# Patient Record
Sex: Male | Born: 1935 | Race: White | Hispanic: No | Smoking: Never smoker
Health system: Southern US, Community
[De-identification: ages and names within clinical notes are randomized; demographics above are authoritative.]

## PROBLEM LIST (undated history)

## (undated) DIAGNOSIS — R32 Unspecified urinary incontinence: Secondary | ICD-10-CM

## (undated) DIAGNOSIS — E119 Type 2 diabetes mellitus without complications: Secondary | ICD-10-CM

## (undated) DIAGNOSIS — C61 Malignant neoplasm of prostate: Secondary | ICD-10-CM

## (undated) DIAGNOSIS — N189 Chronic kidney disease, unspecified: Secondary | ICD-10-CM

## (undated) HISTORY — DX: Type 2 diabetes mellitus without complications: E11.9

## (undated) HISTORY — PX: PROSTATE SURGERY: SHX751

## (undated) HISTORY — DX: Chronic kidney disease, unspecified: N18.9

---

## 2008-12-31 ENCOUNTER — Inpatient Hospital Stay: Payer: Self-pay | Admitting: Internal Medicine

## 2010-11-21 DIAGNOSIS — G4700 Insomnia, unspecified: Secondary | ICD-10-CM | POA: Insufficient documentation

## 2011-02-25 DIAGNOSIS — R269 Unspecified abnormalities of gait and mobility: Secondary | ICD-10-CM | POA: Insufficient documentation

## 2013-02-16 DIAGNOSIS — E119 Type 2 diabetes mellitus without complications: Secondary | ICD-10-CM | POA: Insufficient documentation

## 2013-03-10 DIAGNOSIS — I10 Essential (primary) hypertension: Secondary | ICD-10-CM | POA: Insufficient documentation

## 2013-06-24 ENCOUNTER — Ambulatory Visit (INDEPENDENT_AMBULATORY_CARE_PROVIDER_SITE_OTHER): Payer: Medicare Other | Admitting: Podiatry

## 2013-06-24 ENCOUNTER — Encounter: Payer: Self-pay | Admitting: Podiatry

## 2013-06-24 VITALS — BP 129/74 | HR 70 | Resp 14 | Ht 71.0 in | Wt 158.0 lb

## 2013-06-24 DIAGNOSIS — M79609 Pain in unspecified limb: Secondary | ICD-10-CM

## 2013-06-24 DIAGNOSIS — B351 Tinea unguium: Secondary | ICD-10-CM

## 2013-06-24 NOTE — Progress Notes (Signed)
N diabetic foot care. Painful thick toenails  L b/l  1-5  D diabetic 10years  a1c 6.4 O C A T try to cut self but keep cutting skin

## 2013-06-24 NOTE — Progress Notes (Signed)
Subjective:     Patient ID: Derek Guerra, male   DOB: [birth year], 79 y.o.   MRN: [id number]  HPI patient presents on referral from family physician stating my nails are thick painful and I cannot cut them anymore. States he has had diabetes for 10 years   Review of Systems  All other systems reviewed and are negative.       Objective:   Physical Exam  Nursing note and vitals reviewed. Constitutional: He appears well-developed and well-nourished.  Cardiovascular: Intact distal pulses.   Musculoskeletal: Normal range of motion.  Neurological: He is alert.   muscle strength found to be adequate with mild restriction of motion subtalar and midtarsal joint. Nailbeds are thick and dystrophic 1-5 bilateral     Assessment:     Long-term diabetic with thick painful nailbeds 1-5 bilateral    Plan:     H&P performed with diabetic education discussed. Debridement nailbeds 1-5 bilateral no iatrogenic bleeding noted

## 2013-09-23 ENCOUNTER — Ambulatory Visit: Payer: Medicare Other | Admitting: Podiatry

## 2013-09-27 ENCOUNTER — Ambulatory Visit: Payer: Medicare Other | Admitting: Podiatry

## 2013-09-30 ENCOUNTER — Ambulatory Visit (INDEPENDENT_AMBULATORY_CARE_PROVIDER_SITE_OTHER): Payer: Medicare Other | Admitting: Podiatry

## 2013-09-30 ENCOUNTER — Encounter: Payer: Self-pay | Admitting: Podiatry

## 2013-09-30 VITALS — BP 117/76 | HR 78 | Resp 16 | Ht 70.0 in | Wt 157.0 lb

## 2013-09-30 DIAGNOSIS — M79609 Pain in unspecified limb: Secondary | ICD-10-CM

## 2013-09-30 DIAGNOSIS — B351 Tinea unguium: Secondary | ICD-10-CM

## 2013-09-30 NOTE — Progress Notes (Signed)
Subjective:     Patient ID: Derek Guerra, male   DOB: 1932-02-13, 80 y.o.   MRN: [id number]  HPI patient presents with nail disease and thickness of bed 1 through 5 of both feet that are painful   Review of Systems     Objective:   Physical Exam Neurovascular status unchanged with painful nailbeds 1-5 both feet that are thick and the corner    Assessment:     Mycotic nail infection with pain 1-5 both feet    Plan:     Debridement painful nail bed 1-5 both feet with no active bleeding noted

## 2013-12-30 ENCOUNTER — Ambulatory Visit: Payer: Medicare Other | Admitting: Podiatry

## 2014-01-06 ENCOUNTER — Ambulatory Visit (INDEPENDENT_AMBULATORY_CARE_PROVIDER_SITE_OTHER): Payer: Medicare Other | Admitting: Podiatry

## 2014-01-06 VITALS — Resp 16 | Ht 71.0 in | Wt 160.0 lb

## 2014-01-06 DIAGNOSIS — B351 Tinea unguium: Secondary | ICD-10-CM

## 2014-01-06 DIAGNOSIS — M79609 Pain in unspecified limb: Secondary | ICD-10-CM

## 2014-01-08 NOTE — Progress Notes (Signed)
Subjective:     Patient ID: Derek Guerra, male   DOB: 02/06/1932, 80 y.o.   MRN: [id number]  HPI patient presents with painful nailbeds of both feet that are thick and impossible for him to cut   Review of Systems     Objective:   Physical Exam Neurovascular status unchanged with patient well oriented x3 and is found to have thick painful nail bed 1-5 both feet with brittle yellow appearance    Assessment:     Mycotic nail infection with pain 1-5 both feet    Plan:     Debridement painful nail bed 1-5 both feet with no iatrogenic bleeding noted

## 2014-03-20 DIAGNOSIS — Z8546 Personal history of malignant neoplasm of prostate: Secondary | ICD-10-CM | POA: Insufficient documentation

## 2014-03-23 DIAGNOSIS — N319 Neuromuscular dysfunction of bladder, unspecified: Secondary | ICD-10-CM | POA: Insufficient documentation

## 2014-03-27 DIAGNOSIS — E1122 Type 2 diabetes mellitus with diabetic chronic kidney disease: Secondary | ICD-10-CM | POA: Insufficient documentation

## 2014-06-09 ENCOUNTER — Ambulatory Visit (INDEPENDENT_AMBULATORY_CARE_PROVIDER_SITE_OTHER): Payer: Medicare Other | Admitting: Podiatry

## 2014-06-09 DIAGNOSIS — B351 Tinea unguium: Secondary | ICD-10-CM

## 2014-06-09 DIAGNOSIS — M79676 Pain in unspecified toe(s): Secondary | ICD-10-CM

## 2014-06-11 NOTE — Progress Notes (Signed)
Subjective:     Patient ID: Derek Guerra, male   DOB: 01/21/1932, 80 y.o.   MRN: [id number]  HPI patient presents with painful nailbeds 1-5 both feet that he cannot cut   Review of Systems     Objective:   Physical Exam Neurovascular status intact with thick yellow brittle nailbeds 1-5 of both feet    Assessment:     Mycotic nail infection with pain 1-5 both feet    Plan:     Debris painful nailbeds 1-5 both feet with no iatrogenic bleeding noted

## 2014-08-14 DIAGNOSIS — Z9889 Other specified postprocedural states: Secondary | ICD-10-CM | POA: Insufficient documentation

## 2014-08-14 DIAGNOSIS — N289 Disorder of kidney and ureter, unspecified: Secondary | ICD-10-CM | POA: Insufficient documentation

## 2014-08-14 DIAGNOSIS — I700 Atherosclerosis of aorta: Secondary | ICD-10-CM | POA: Insufficient documentation

## 2014-08-14 DIAGNOSIS — I739 Peripheral vascular disease, unspecified: Secondary | ICD-10-CM | POA: Insufficient documentation

## 2014-08-14 DIAGNOSIS — E119 Type 2 diabetes mellitus without complications: Secondary | ICD-10-CM | POA: Insufficient documentation

## 2014-08-14 DIAGNOSIS — I519 Heart disease, unspecified: Secondary | ICD-10-CM | POA: Insufficient documentation

## 2014-08-14 DIAGNOSIS — E782 Mixed hyperlipidemia: Secondary | ICD-10-CM | POA: Insufficient documentation

## 2014-09-15 ENCOUNTER — Ambulatory Visit (INDEPENDENT_AMBULATORY_CARE_PROVIDER_SITE_OTHER): Payer: Medicare Other | Admitting: Podiatry

## 2014-09-15 DIAGNOSIS — B351 Tinea unguium: Secondary | ICD-10-CM

## 2014-09-15 DIAGNOSIS — M79676 Pain in unspecified toe(s): Secondary | ICD-10-CM | POA: Diagnosis not present

## 2014-09-15 NOTE — Progress Notes (Signed)
Subjective:     Patient ID: Derek Guerra, male   DOB: 1932/05/25, 82 y.o.   MRN: [id number]  HPI patient presents with painful nailbeds 1-5 both feet that he cannot cut   Review of Systems     Objective:   Physical Exam Neurovascular status intact with thick yellow brittle nailbeds 1-5 of both feet    Assessment:     Mycotic nail infection with pain 1-5 both feet    Plan:     Debris painful nailbeds 1-5 both feet with no iatrogenic bleeding noted

## 2014-10-10 DIAGNOSIS — N3020 Other chronic cystitis without hematuria: Secondary | ICD-10-CM | POA: Insufficient documentation

## 2014-10-10 DIAGNOSIS — R339 Retention of urine, unspecified: Secondary | ICD-10-CM | POA: Insufficient documentation

## 2014-12-01 ENCOUNTER — Ambulatory Visit: Payer: Medicare Other

## 2014-12-08 ENCOUNTER — Ambulatory Visit (INDEPENDENT_AMBULATORY_CARE_PROVIDER_SITE_OTHER): Payer: Medicare Other | Admitting: Podiatry

## 2014-12-08 DIAGNOSIS — B351 Tinea unguium: Secondary | ICD-10-CM

## 2014-12-08 DIAGNOSIS — M79676 Pain in unspecified toe(s): Secondary | ICD-10-CM

## 2014-12-08 NOTE — Progress Notes (Signed)
Subjective:     Patient ID: Derek Guerra, male   DOB: 17-Jun-1932, 81 y.o.   MRN: [id number]  HPI patient presents with painful nailbeds 1-5 both feet that he cannot cut   Review of Systems     Objective:   Physical Exam Neurovascular status intact with thick yellow brittle nailbeds 1-5 of both feet    Assessment:     Mycotic nail infection with pain 1-5 both feet    Plan:     Debris painful nailbeds 1-5 both feet with no iatrogenic bleeding noted

## 2015-02-02 DIAGNOSIS — I10 Essential (primary) hypertension: Secondary | ICD-10-CM | POA: Insufficient documentation

## 2015-02-14 DIAGNOSIS — I493 Ventricular premature depolarization: Secondary | ICD-10-CM | POA: Insufficient documentation

## 2015-03-09 ENCOUNTER — Ambulatory Visit: Payer: Medicare Other

## 2015-03-20 ENCOUNTER — Ambulatory Visit (INDEPENDENT_AMBULATORY_CARE_PROVIDER_SITE_OTHER): Payer: Medicare Other | Admitting: Podiatry

## 2015-03-20 DIAGNOSIS — M79676 Pain in unspecified toe(s): Secondary | ICD-10-CM

## 2015-03-20 DIAGNOSIS — B351 Tinea unguium: Secondary | ICD-10-CM | POA: Diagnosis not present

## 2015-03-20 NOTE — Progress Notes (Signed)
Subjective: 81 y.o. male returns the office today for painful, elongated, thickened toenails which he is unable to trim himself. Denies any redness or drainage around the nails. Denies any acute changes since last appointment and no new complaints today. Denies any systemic complaints such as fevers, chills, nausea, vomiting.   Objective: AAO 3, NAD DP/PT pulses palpable, CRT less than 3 seconds Nails hypertrophic, dystrophic, elongated, brittle, discolored 10. There is tenderness overlying the nails 1-5 bilaterally. There is no surrounding erythema or drainage along the nail sites. No open lesions or pre-ulcerative lesions are identified. No other areas of tenderness bilateral lower extremities. No overlying edema, erythema, increased warmth. No pain with calf compression, swelling, warmth, erythema.  Assessment: Patient presents with symptomatic onychomycosis  Plan: -Treatment options including alternatives, risks, complications were discussed -Nails sharply debrided 10 without complication/bleeding. -Discussed daily foot inspection. If there are any changes, to call the office immediately.  -Follow-up in 3 months or sooner if any problems are to arise. In the meantime, encouraged to call the office with any questions, concerns, changes symptoms.   Celesta Gentile, DPM

## 2015-06-19 ENCOUNTER — Ambulatory Visit: Payer: Medicare Other

## 2015-06-19 ENCOUNTER — Encounter: Payer: Self-pay | Admitting: Sports Medicine

## 2015-06-19 ENCOUNTER — Ambulatory Visit (INDEPENDENT_AMBULATORY_CARE_PROVIDER_SITE_OTHER): Payer: Medicare Other | Admitting: Sports Medicine

## 2015-06-19 VITALS — BP 138/83 | HR 72 | Resp 14

## 2015-06-19 DIAGNOSIS — B351 Tinea unguium: Secondary | ICD-10-CM | POA: Diagnosis not present

## 2015-06-19 DIAGNOSIS — M79673 Pain in unspecified foot: Secondary | ICD-10-CM | POA: Diagnosis not present

## 2015-06-19 DIAGNOSIS — M79605 Pain in left leg: Secondary | ICD-10-CM

## 2015-06-19 DIAGNOSIS — E119 Type 2 diabetes mellitus without complications: Secondary | ICD-10-CM | POA: Diagnosis not present

## 2015-06-19 DIAGNOSIS — M79604 Pain in right leg: Secondary | ICD-10-CM

## 2015-06-19 NOTE — Progress Notes (Signed)
Patient ID: Derek Guerra, male   DOB: [birth year], 81 y.o.   MRN: [id number]  Subjective: Derek Guerra is a 81 y.o. male patient with history of type 2 diabetes who presents to office today complaining of long, painful nails  while ambulating in shoes. Patient states that the glucose reading this morning was not recorderd. Diagnosed with DM many years ago. Patient denies any new changes in medication or new problems. Patient denies any new cramping, numbness, burning or tingling in the legs.  There are no active problems to display for this patient.  Current Outpatient Prescriptions on File Prior to Visit  Medication Sig Dispense Refill  . amitriptyline (ELAVIL) 10 MG tablet Take 10 mg by mouth. 2 tablets at bedtime    . aspirin 81 MG tablet Take 81 mg by mouth daily.    . enalapril (VASOTEC) 10 MG tablet Take 10 mg by mouth daily.    Marland Kitchen linagliptin (TRADJENTA) 5 MG TABS tablet Take 5 mg by mouth daily.    . rosuvastatin (CRESTOR) 5 MG tablet Take 5 mg by mouth. 3 times  weekly     No current facility-administered medications on file prior to visit.   Allergies  Allergen Reactions  . Gemfibrozil     Other reaction(s): MUSCLE PAIN  . Niacin     Other reaction(s): MUSCLE PAIN  . Nifedipine     Other reaction(s): MUSCLE PAIN  . Simvastatin     Other reaction(s): MUSCLE PAIN     Labs:HEMOGLOBIN A1C- No recent lab on file   Objective: General: Patient is awake, alert, and oriented x 3 and in no acute distress. Ambulates with assistance of rolling walker.   Integument: Skin is warm, dry and supple bilateral. Nails are tender, long, thickened and  dystrophic with subungual debris, consistent with onychomycosis, 1-5 bilateral. No signs of infection. No open lesions or preulcerative lesions present bilateral. Remaining integument unremarkable.  Vasculature:  Dorsalis Pedis pulse 1/4 bilateral. Posterior Tibial pulse  2/4 bilateral.  Capillary fill time <3 sec 1-5 bilateral. Scant hair growth  to the level of the digits. Temperature gradient within normal limits. Mild varicosities present bilateral. No edema present bilateral.   Neurology: The patient has intact sensation measured with a 5.07/10g Semmes Weinstein Monofilament at all pedal sites bilateral . Vibratory sensation slightly diminished bilateral with tuning fork. No Babinski sign present bilateral.   Musculoskeletal: No gross pedal deformities noted bilateral. Muscular strength 5/5 in all lower extremity muscular groups bilateral without pain or limitation on range of motion . No tenderness with calf compression bilateral.  Assessment and Plan: Problem List Items Addressed This Visit    None    Visit Diagnoses    Dermatophytosis of nail    -  Primary    Pain in both lower extremities        Diabetes mellitus without complication (HCC)        Relevant Medications    metFORMIN (GLUCOPHAGE) 1000 MG tablet      -Examined patient. -Discussed and educated patient on diabetic foot care, especially with  regards to the vascular, neurological and musculoskeletal systems.  -Stressed the importance of good glycemic control and the detriment of not  controlling glucose levels in relation to the foot. -Mechanically debrided all nails 1-5 bilateral using sterile nail nipper and filed with dremel without incident  -Answered all patient questions -Patient to return in 3 months for diabetic foot care -Patient advised to call the office if any problems or questions arise in  the  Meantime.  [name], DPM

## 2015-08-22 DIAGNOSIS — R2681 Unsteadiness on feet: Secondary | ICD-10-CM | POA: Insufficient documentation

## 2015-09-18 ENCOUNTER — Encounter: Payer: Self-pay | Admitting: Sports Medicine

## 2015-09-18 ENCOUNTER — Ambulatory Visit (INDEPENDENT_AMBULATORY_CARE_PROVIDER_SITE_OTHER): Payer: Medicare Other | Admitting: Sports Medicine

## 2015-09-18 ENCOUNTER — Ambulatory Visit: Payer: Medicare Other | Admitting: Sports Medicine

## 2015-09-18 DIAGNOSIS — E119 Type 2 diabetes mellitus without complications: Secondary | ICD-10-CM | POA: Diagnosis not present

## 2015-09-18 DIAGNOSIS — M79676 Pain in unspecified toe(s): Secondary | ICD-10-CM | POA: Diagnosis not present

## 2015-09-18 DIAGNOSIS — B351 Tinea unguium: Secondary | ICD-10-CM | POA: Diagnosis not present

## 2015-09-18 NOTE — Progress Notes (Signed)
Patient ID: Derek Guerra, male   DOB: Feb 27, 1932, 82 y.o.   MRN: [id number]  Subjective: Derek Guerra is a 82 y.o. male patient with history of type 2 diabetes who presents to office today complaining of long, painful nails  while ambulating in shoes. Patient states that the glucose reading this morning was not recorderd. Patient denies any new changes in medication or new problems. Patient denies any new cramping, numbness, burning or tingling in the legs.  There are no active problems to display for this patient.  Current Outpatient Prescriptions on File Prior to Visit  Medication Sig Dispense Refill  . amitriptyline (ELAVIL) 10 MG tablet Take 10 mg by mouth. 2 tablets at bedtime    . aspirin 81 MG tablet Take 81 mg by mouth daily.    . enalapril (VASOTEC) 10 MG tablet Take 10 mg by mouth daily.    Marland Kitchen linagliptin (TRADJENTA) 5 MG TABS tablet Take 5 mg by mouth daily.    . metFORMIN (GLUCOPHAGE) 1000 MG tablet     . nitrofurantoin (MACRODANTIN) 50 MG capsule TAKE 1 CAPSULE (50 MG TOTAL) BY MOUTH DAILY. TO START AFTER LEVAQUIN  2  . rosuvastatin (CRESTOR) 5 MG tablet Take 5 mg by mouth. 3 times  weekly    . tamsulosin (FLOMAX) 0.4 MG CAPS capsule TAKE 1 CAPSULE (0.4 MG TOTAL) BY MOUTH DAILY WITH BREAKFAST.  3   No current facility-administered medications on file prior to visit.   Allergies  Allergen Reactions  . Gemfibrozil Other (See Comments)    Other reaction(s): MUSCLE PAIN Other reaction(s): MUSCLE PAIN  . Niacin Other (See Comments)    Other reaction(s): MUSCLE PAIN Other reaction(s): MUSCLE PAIN  . Nifedipine Other (See Comments)    Other reaction(s): MUSCLE PAIN Other reaction(s): MUSCLE PAIN  . Simvastatin Other (See Comments)    Other reaction(s): MUSCLE PAIN Other reaction(s): MUSCLE PAIN   Labs:HEMOGLOBIN A1C- No recent lab on file   Objective: General: Patient is awake, alert, and oriented x 3 and in no acute distress. Ambulates with assistance of rolling walker.    Integument: Skin is warm, dry and supple bilateral. Nails are tender, long, thickened and  dystrophic with subungual debris, consistent with onychomycosis, 1-5 bilateral. No signs of infection. No open lesions or preulcerative lesions present bilateral. Remaining integument unremarkable.  Vasculature:  Dorsalis Pedis pulse 1/4 bilateral. Posterior Tibial pulse  2/4 bilateral.  Capillary fill time <3 sec 1-5 bilateral. Scant hair growth to the level of the digits. Temperature gradient within normal limits. Mild varicosities present bilateral. No edema present bilateral.   Neurology: The patient has intact sensation measured with a 5.07/10g Semmes Weinstein Monofilament at all pedal sites bilateral . Vibratory sensation slightly diminished bilateral with tuning fork. No Babinski sign present bilateral.   Musculoskeletal: No gross pedal deformities noted bilateral. Muscular strength 5/5 in all lower extremity muscular groups bilateral without pain or limitation on range of motion . No tenderness with calf compression bilateral.  Assessment and Plan: Problem List Items Addressed This Visit    None    Visit Diagnoses    Dermatophytosis of nail    -  Primary    Pain of toe, unspecified laterality        Diabetes mellitus without complication ([place])          -Examined patient. -Discussed and educated patient on diabetic foot care, especially with  regards to the vascular, neurological and musculoskeletal systems.  -Stressed the importance of good glycemic control and the  detriment of not  controlling glucose levels in relation to the foot. -Mechanically debrided all nails 1-5 bilateral using sterile nail nipper and filed with dremel without incident  -Answered all patient questions -Patient to return in 3 months for diabetic foot care -Patient advised to call the office if any problems or questions arise in the meantime.  [name], DPM

## 2015-12-18 ENCOUNTER — Encounter: Payer: Self-pay | Admitting: Sports Medicine

## 2015-12-18 ENCOUNTER — Ambulatory Visit (INDEPENDENT_AMBULATORY_CARE_PROVIDER_SITE_OTHER): Payer: Medicare Other | Admitting: Sports Medicine

## 2015-12-18 DIAGNOSIS — M79676 Pain in unspecified toe(s): Secondary | ICD-10-CM

## 2015-12-18 DIAGNOSIS — B351 Tinea unguium: Secondary | ICD-10-CM

## 2015-12-18 DIAGNOSIS — E119 Type 2 diabetes mellitus without complications: Secondary | ICD-10-CM | POA: Diagnosis not present

## 2015-12-18 NOTE — Progress Notes (Signed)
Patient ID: Derek Guerra, male   DOB: June 30, 1932, 83 y.o.   MRN: [id number] Subjective: Derek Guerra is a 82 y.o. male patient with history of type 2 diabetes who presents to office today complaining of long, painful nails  while ambulating in shoes. Patient states that the glucose reading this morning was not recorderd. Patient denies any new changes in medication or new problems. Patient denies any new cramping, numbness, burning or tingling in the legs.  There are no active problems to display for this patient.  Current Outpatient Prescriptions on File Prior to Visit  Medication Sig Dispense Refill  . amitriptyline (ELAVIL) 10 MG tablet Take 10 mg by mouth. 2 tablets at bedtime    . aspirin 81 MG tablet Take 81 mg by mouth daily.    . enalapril (VASOTEC) 10 MG tablet Take 10 mg by mouth daily.    Marland Kitchen linagliptin (TRADJENTA) 5 MG TABS tablet Take 5 mg by mouth daily.    . metFORMIN (GLUCOPHAGE) 1000 MG tablet     . nitrofurantoin (MACRODANTIN) 50 MG capsule TAKE 1 CAPSULE (50 MG TOTAL) BY MOUTH DAILY. TO START AFTER LEVAQUIN  2  . rosuvastatin (CRESTOR) 5 MG tablet Take 5 mg by mouth. 3 times  weekly    . tamsulosin (FLOMAX) 0.4 MG CAPS capsule TAKE 1 CAPSULE (0.4 MG TOTAL) BY MOUTH DAILY WITH BREAKFAST.  3   No current facility-administered medications on file prior to visit.   Allergies  Allergen Reactions  . Gemfibrozil Other (See Comments)    Other reaction(s): MUSCLE PAIN Other reaction(s): MUSCLE PAIN  . Niacin Other (See Comments)    Other reaction(s): MUSCLE PAIN Other reaction(s): MUSCLE PAIN  . Nifedipine Other (See Comments)    Other reaction(s): MUSCLE PAIN Other reaction(s): MUSCLE PAIN  . Simvastatin Other (See Comments)    Other reaction(s): MUSCLE PAIN Other reaction(s): MUSCLE PAIN   Labs:HEMOGLOBIN A1C- No recent lab on file   Objective: General: Patient is awake, alert, and oriented x 3 and in no acute distress. Ambulates with assistance of rolling walker.    Integument: Skin is warm, dry and supple bilateral. Nails are tender, long, thickened and  dystrophic with subungual debris, consistent with onychomycosis, 1-5 bilateral. Dry blood under left 4th toenail with no signs of infection. No open lesions or preulcerative lesions present bilateral. Remaining integument unremarkable.  Vasculature:  Dorsalis Pedis pulse 1/4 bilateral. Posterior Tibial pulse  2/4 bilateral.  Capillary fill time <3 sec 1-5 bilateral. Scant hair growth to the level of the digits. Temperature gradient within normal limits. Mild varicosities present bilateral. No edema present bilateral.   Neurology: The patient has intact sensation measured with a 5.07/10g Semmes Weinstein Monofilament at all pedal sites bilateral . Vibratory sensation slightly diminished bilateral with tuning fork. No Babinski sign present bilateral.   Musculoskeletal: No gross pedal deformities noted bilateral. Muscular strength 5/5 in all lower extremity muscular groups bilateral without pain or limitation on range of motion . No tenderness with calf compression bilateral.  Assessment and Plan: Problem List Items Addressed This Visit    None    Visit Diagnoses    Dermatophytosis of nail    -  Primary    Pain of toe, unspecified laterality        Diabetes mellitus without complication ([place])          -Examined patient. -Discussed and educated patient on diabetic foot care, especially with  regards to the vascular, neurological and musculoskeletal systems.  -Stressed the importance  of good glycemic control and the detriment of not  controlling glucose levels in relation to the foot. -Mechanically debrided all nails 1-5 bilateral using sterile nail nipper and filed with dremel without incident  -Answered all patient questions -Patient to return in 3 months for diabetic foot care -Patient advised to call the office if any problems or questions arise in the meantime.  [name], DPM

## 2016-03-18 ENCOUNTER — Encounter: Payer: Self-pay | Admitting: Sports Medicine

## 2016-03-18 ENCOUNTER — Ambulatory Visit (INDEPENDENT_AMBULATORY_CARE_PROVIDER_SITE_OTHER): Payer: Medicare Other | Admitting: Sports Medicine

## 2016-03-18 DIAGNOSIS — E119 Type 2 diabetes mellitus without complications: Secondary | ICD-10-CM | POA: Diagnosis not present

## 2016-03-18 DIAGNOSIS — B351 Tinea unguium: Secondary | ICD-10-CM

## 2016-03-18 DIAGNOSIS — M79676 Pain in unspecified toe(s): Secondary | ICD-10-CM | POA: Diagnosis not present

## 2016-03-18 NOTE — Progress Notes (Signed)
Patient ID: Derek Guerra, male   DOB: 1932-04-20, 82 y.o.   MRN: [id number]  Subjective: Derek Guerra is a 82 y.o. male patient with history of type 2 diabetes who presents to office today complaining of long, painful nails  while ambulating in shoes. Patient states that the glucose reading this morning was not recorded; a1c 6.5. Patient denies any new changes in medication or new problems. Patient denies any new cramping, numbness, burning or tingling in the legs.  Patient Active Problem List   Diagnosis Date Noted  . Unsteadiness 08/22/2015  . Beat, premature ventricular 02/14/2015  . Benign essential HTN 02/02/2015  . Bladder infection, chronic 10/10/2014  . Incomplete bladder emptying 10/10/2014  . Atherosclerosis of aorta ([place]) 08/14/2014  . Diabetes mellitus ([place]) 08/14/2014  . History of cardiac catheterization 08/14/2014  . Left ventricular dysfunction 08/14/2014  . Combined fat and carbohydrate induced hyperlipemia 08/14/2014  . Peripheral vascular disease ([place]) 08/14/2014  . Disorder of kidney and ureter 08/14/2014  . Type 2 diabetes mellitus with diabetic chronic kidney disease ([place]) 03/27/2014  . Acontractile bladder 03/23/2014  . H/O malignant neoplasm of prostate 03/20/2014  . BP (high blood pressure) 03/10/2013  . Type 2 diabetes mellitus ([place]) 02/16/2013  . Abnormal gait 02/25/2011  . Cannot sleep 11/21/2010   Current Outpatient Prescriptions on File Prior to Visit  Medication Sig Dispense Refill  . amitriptyline (ELAVIL) 10 MG tablet Take 10 mg by mouth. 2 tablets at bedtime    . aspirin 81 MG tablet Take 81 mg by mouth daily.    . enalapril (VASOTEC) 10 MG tablet Take 10 mg by mouth daily.    Marland Kitchen linagliptin (TRADJENTA) 5 MG TABS tablet Take 5 mg by mouth daily.    . metFORMIN (GLUCOPHAGE) 1000 MG tablet     . nitrofurantoin (MACRODANTIN) 50 MG capsule TAKE 1 CAPSULE (50 MG TOTAL) BY MOUTH DAILY. TO START AFTER LEVAQUIN  2  . rosuvastatin (CRESTOR) 5 MG tablet Take 5 mg  by mouth. 3 times  weekly    . tamsulosin (FLOMAX) 0.4 MG CAPS capsule TAKE 1 CAPSULE (0.4 MG TOTAL) BY MOUTH DAILY WITH BREAKFAST.  3   No current facility-administered medications on file prior to visit.   Allergies  Allergen Reactions  . Gemfibrozil Other (See Comments)    Other reaction(s): MUSCLE PAIN Other reaction(s): MUSCLE PAIN  . Niacin Other (See Comments)    Other reaction(s): MUSCLE PAIN Other reaction(s): MUSCLE PAIN  . Nifedipine Other (See Comments)    Other reaction(s): MUSCLE PAIN Other reaction(s): MUSCLE PAIN  . Simvastatin Other (See Comments)    Other reaction(s): MUSCLE PAIN Other reaction(s): MUSCLE PAIN   Labs:HEMOGLOBIN A1C- No recent lab on file   Objective: General: Patient is awake, alert, and oriented x 3 and in no acute distress. Ambulates with assistance of rolling walker.   Integument: Skin is warm, dry and supple bilateral. Nails are tender, long, thickened and  dystrophic with subungual debris, consistent with onychomycosis, 1-5 bilateral. Dry blood under left 4th toenail with no signs of infection. No open lesions or preulcerative lesions present bilateral. Remaining integument unremarkable.  Vasculature:  Dorsalis Pedis pulse 1/4 bilateral. Posterior Tibial pulse  2/4 bilateral.  Capillary fill time <3 sec 1-5 bilateral. Scant hair growth to the level of the digits. Temperature gradient within normal limits. Mild varicosities present bilateral. No edema present bilateral.   Neurology: The patient has intact sensation measured with a 5.07/10g Semmes Weinstein Monofilament at all pedal sites bilateral .  Vibratory sensation slightly diminished bilateral with tuning fork. No Babinski sign present bilateral.   Musculoskeletal: No gross pedal deformities noted bilateral. Muscular strength 5/5 in all lower extremity muscular groups bilateral without pain or limitation on range of motion . No tenderness with calf compression bilateral.  Assessment and  Plan: Problem List Items Addressed This Visit    None    Visit Diagnoses    Dermatophytosis of nail    -  Primary    Pain of toe, unspecified laterality        Diabetes mellitus without complication ([name])          -Examined patient. -Discussed and educated patient on diabetic foot care, especially with  regards to the vascular, neurological and musculoskeletal systems.  -Stressed the importance of good glycemic control and the detriment of not  controlling glucose levels in relation to the foot. -Mechanically debrided all nails 1-5 bilateral using sterile nail nipper and filed with dremel without incident  -Answered all patient questions -Patient to return in 3 months for diabetic foot care -Patient advised to call the office if any problems or questions arise in the meantime.  Derek Guerra, DPM

## 2016-06-17 ENCOUNTER — Encounter: Payer: Self-pay | Admitting: Podiatry

## 2016-06-17 ENCOUNTER — Ambulatory Visit (INDEPENDENT_AMBULATORY_CARE_PROVIDER_SITE_OTHER): Payer: Medicare Other | Admitting: Podiatry

## 2016-06-17 DIAGNOSIS — E0843 Diabetes mellitus due to underlying condition with diabetic autonomic (poly)neuropathy: Secondary | ICD-10-CM

## 2016-06-17 DIAGNOSIS — M79676 Pain in unspecified toe(s): Secondary | ICD-10-CM | POA: Diagnosis not present

## 2016-06-17 DIAGNOSIS — M79609 Pain in unspecified limb: Principal | ICD-10-CM

## 2016-06-17 DIAGNOSIS — L608 Other nail disorders: Secondary | ICD-10-CM

## 2016-06-17 DIAGNOSIS — L603 Nail dystrophy: Secondary | ICD-10-CM

## 2016-06-17 DIAGNOSIS — B351 Tinea unguium: Secondary | ICD-10-CM

## 2016-06-18 NOTE — Progress Notes (Signed)
SUBJECTIVE Patient with a history of diabetes mellitus presents to office today complaining of elongated, thickened nails. Pain while ambulating in shoes. Patient is unable to trim their own nails.   Allergies  Allergen Reactions  . Gemfibrozil Other (See Comments)    Other reaction(s): MUSCLE PAIN Other reaction(s): MUSCLE PAIN  . Niacin Other (See Comments)    Other reaction(s): MUSCLE PAIN Other reaction(s): MUSCLE PAIN  . Nifedipine Other (See Comments)    Other reaction(s): MUSCLE PAIN Other reaction(s): MUSCLE PAIN  . Simvastatin Other (See Comments)    Other reaction(s): MUSCLE PAIN Other reaction(s): MUSCLE PAIN    OBJECTIVE General Patient is awake, alert, and oriented x 3 and in no acute distress. Derm Skin is dry and supple bilateral. Negative open lesions or macerations. Remaining integument unremarkable. Nails are tender, long, thickened and dystrophic with subungual debris, consistent with onychomycosis, 1-5 bilateral. No signs of infection noted. Vasc  DP and PT pedal pulses palpable bilaterally. Temperature gradient within normal limits.  Neuro Epicritic and protective threshold sensation diminished bilaterally.  Musculoskeletal Exam No symptomatic pedal deformities noted bilateral. Muscular strength within normal limits.  ASSESSMENT 1. Diabetes Mellitus w/ peripheral neuropathy 2. Onychomycosis of nail due to dermatophyte bilateral 3. Pain in foot bilateral  PLAN OF CARE 1. Patient evaluated today. 2. Instructed to maintain good pedal hygiene and foot care. Stressed importance of controlling blood sugar.  3. Mechanical debridement of nails 1-5 bilaterally performed using a nail nipper. Filed with dremel without incident.  4. Return to clinic in 3 mos.    Edrick Kins, DPM

## 2016-09-23 ENCOUNTER — Ambulatory Visit: Payer: Medicare Other | Admitting: Podiatry

## 2016-10-10 ENCOUNTER — Ambulatory Visit: Payer: Medicare Other | Admitting: Podiatry

## 2016-10-13 ENCOUNTER — Encounter: Payer: Self-pay | Admitting: Podiatry

## 2016-10-13 ENCOUNTER — Ambulatory Visit (INDEPENDENT_AMBULATORY_CARE_PROVIDER_SITE_OTHER): Payer: Medicare Other | Admitting: Podiatry

## 2016-10-13 DIAGNOSIS — E0843 Diabetes mellitus due to underlying condition with diabetic autonomic (poly)neuropathy: Secondary | ICD-10-CM

## 2016-10-13 DIAGNOSIS — M79609 Pain in unspecified limb: Secondary | ICD-10-CM

## 2016-10-13 DIAGNOSIS — B351 Tinea unguium: Secondary | ICD-10-CM | POA: Diagnosis not present

## 2016-10-13 NOTE — Progress Notes (Signed)
Complaint:  Visit Type: Patient returns to my office for continued preventative foot care services. Complaint: Patient states" my nails have grown long and thick and become painful to walk and wear shoes" Patient has been diagnosed with DM with no foot complications. The patient presents for preventative foot care services. No changes to ROS  Podiatric Exam: Vascular: dorsalis pedis and posterior tibial pulses are palpable bilateral. Capillary return is immediate. Temperature gradient is WNL. Skin turgor WNL  Sensorium: Diminished  Semmes Weinstein monofilament test. Normal tactile sensation bilaterally. Nail Exam: Pt has thick disfigured discolored nails with subungual debris noted bilateral entire nail hallux through fifth toenails Ulcer Exam: There is no evidence of ulcer or pre-ulcerative changes or infection. Orthopedic Exam: Muscle tone and strength are WNL. No limitations in general ROM. No crepitus or effusions noted. Foot type and digits show no abnormalities. Bony prominences are unremarkable. Skin: No Porokeratosis. No infection or ulcers  Diagnosis:  Onychomycosis, , Pain in right toe, pain in left toes  Treatment & Plan Procedures and Treatment: Consent by patient was obtained for treatment procedures. The patient understood the discussion of treatment and procedures well. All questions were answered thoroughly reviewed. Debridement of mycotic and hypertrophic toenails, 1 through 5 bilateral and clearing of subungual debris. No ulceration, no infection noted.  Return Visit-Office Procedure: Patient instructed to return to the office for a follow up visit 3 months for continued evaluation and treatment.    [name] 

## 2016-11-25 ENCOUNTER — Observation Stay
Admission: EM | Admit: 2016-11-25 | Discharge: 2016-11-28 | Disposition: A | Discharge status: Home / Self Care | Payer: Medicare Other | Attending: Internal Medicine | Admitting: Internal Medicine

## 2016-11-25 DIAGNOSIS — R531 Weakness: Secondary | ICD-10-CM | POA: Diagnosis present

## 2016-11-25 DIAGNOSIS — I2510 Atherosclerotic heart disease of native coronary artery without angina pectoris: Secondary | ICD-10-CM | POA: Insufficient documentation

## 2016-11-25 DIAGNOSIS — I700 Atherosclerosis of aorta: Secondary | ICD-10-CM | POA: Insufficient documentation

## 2016-11-25 DIAGNOSIS — R2681 Unsteadiness on feet: Secondary | ICD-10-CM | POA: Diagnosis not present

## 2016-11-25 DIAGNOSIS — D72819 Decreased white blood cell count, unspecified: Secondary | ICD-10-CM | POA: Diagnosis not present

## 2016-11-25 DIAGNOSIS — D696 Thrombocytopenia, unspecified: Secondary | ICD-10-CM

## 2016-11-25 DIAGNOSIS — I129 Hypertensive chronic kidney disease with stage 1 through stage 4 chronic kidney disease, or unspecified chronic kidney disease: Secondary | ICD-10-CM | POA: Insufficient documentation

## 2016-11-25 DIAGNOSIS — E7801 Familial hypercholesterolemia: Secondary | ICD-10-CM | POA: Insufficient documentation

## 2016-11-25 DIAGNOSIS — R197 Diarrhea, unspecified: Secondary | ICD-10-CM | POA: Insufficient documentation

## 2016-11-25 DIAGNOSIS — Z7982 Long term (current) use of aspirin: Secondary | ICD-10-CM | POA: Diagnosis not present

## 2016-11-25 DIAGNOSIS — R1110 Vomiting, unspecified: Secondary | ICD-10-CM | POA: Insufficient documentation

## 2016-11-25 DIAGNOSIS — K529 Noninfective gastroenteritis and colitis, unspecified: Principal | ICD-10-CM | POA: Insufficient documentation

## 2016-11-25 DIAGNOSIS — E860 Dehydration: Secondary | ICD-10-CM | POA: Insufficient documentation

## 2016-11-25 DIAGNOSIS — K567 Ileus, unspecified: Secondary | ICD-10-CM

## 2016-11-25 DIAGNOSIS — R339 Retention of urine, unspecified: Secondary | ICD-10-CM | POA: Insufficient documentation

## 2016-11-25 DIAGNOSIS — E1110 Type 2 diabetes mellitus with ketoacidosis without coma: Secondary | ICD-10-CM | POA: Diagnosis not present

## 2016-11-25 DIAGNOSIS — E1122 Type 2 diabetes mellitus with diabetic chronic kidney disease: Secondary | ICD-10-CM | POA: Diagnosis not present

## 2016-11-25 DIAGNOSIS — Z8546 Personal history of malignant neoplasm of prostate: Secondary | ICD-10-CM | POA: Diagnosis not present

## 2016-11-25 DIAGNOSIS — N189 Chronic kidney disease, unspecified: Secondary | ICD-10-CM | POA: Insufficient documentation

## 2016-11-25 DIAGNOSIS — Z888 Allergy status to other drugs, medicaments and biological substances status: Secondary | ICD-10-CM | POA: Diagnosis not present

## 2016-11-25 DIAGNOSIS — Z79899 Other long term (current) drug therapy: Secondary | ICD-10-CM | POA: Insufficient documentation

## 2016-11-25 DIAGNOSIS — Z7984 Long term (current) use of oral hypoglycemic drugs: Secondary | ICD-10-CM | POA: Diagnosis not present

## 2016-11-25 DIAGNOSIS — N179 Acute kidney failure, unspecified: Secondary | ICD-10-CM

## 2016-11-25 DIAGNOSIS — R338 Other retention of urine: Secondary | ICD-10-CM

## 2016-11-25 HISTORY — DX: Malignant neoplasm of prostate: C61

## 2016-11-25 LAB — C DIFFICILE QUICK SCREEN W PCR REFLEX
C DIFFICILE (CDIFF) INTERP: NOT DETECTED
C DIFFICILE (CDIFF) TOXIN: NEGATIVE
C Diff antigen: NEGATIVE

## 2016-11-25 LAB — BASIC METABOLIC PANEL
Anion gap: 9 (ref 5–15)
BUN: 38 mg/dL — ABNORMAL HIGH (ref 6–20)
CHLORIDE: 102 mmol/L (ref 101–111)
CO2: 23 mmol/L (ref 22–32)
Calcium: 8.9 mg/dL (ref 8.9–10.3)
Creatinine, Ser: 2.32 mg/dL — ABNORMAL HIGH (ref 0.61–1.24)
GFR, EST AFRICAN AMERICAN: 28 mL/min — AB (ref >60)
GFR, EST NON AFRICAN AMERICAN: 25 mL/min — AB (ref >60)
Glucose, Bld: 238 mg/dL — ABNORMAL HIGH (ref 65–99)
POTASSIUM: 4.4 mmol/L (ref 3.5–5.1)
SODIUM: 134 mmol/L — AB (ref 135–145)

## 2016-11-25 LAB — CBC
HCT: 41.8 % (ref 40.0–52.0)
HEMOGLOBIN: 14.3 g/dL (ref 13.0–18.0)
MCH: 33.4 pg (ref 26.0–34.0)
MCHC: 34.1 g/dL (ref 32.0–36.0)
MCV: 97.8 fL (ref 80.0–100.0)
Platelets: 131 10*3/uL — ABNORMAL LOW (ref 150–440)
RBC: 4.28 MIL/uL — AB (ref 4.40–5.90)
RDW: 13.3 % (ref 11.5–14.5)
WBC: 6.9 10*3/uL (ref 3.8–10.6)

## 2016-11-25 LAB — MAGNESIUM: MAGNESIUM: 1.9 mg/dL (ref 1.7–2.4)

## 2016-11-25 MED ORDER — SODIUM CHLORIDE 0.9 % IV BOLUS (SEPSIS)
1000.0000 mL | Freq: Once | INTRAVENOUS | Status: AC
  Administered 2016-11-25: 1000 mL via INTRAVENOUS

## 2016-11-25 MED ORDER — ONDANSETRON HCL 4 MG/2ML IJ SOLN
4.0000 mg | Freq: Once | INTRAMUSCULAR | Status: AC
  Administered 2016-11-25: 4 mg via INTRAVENOUS
  Filled 2016-11-25: qty 2

## 2016-11-25 MED ORDER — SODIUM CHLORIDE 0.9 % IV BOLUS (SEPSIS)
500.0000 mL | Freq: Once | INTRAVENOUS | Status: AC
  Administered 2016-11-25: 500 mL via INTRAVENOUS

## 2016-11-25 NOTE — ED Notes (Signed)
Pt called out stating he had a BM. Pt bed cleaned and new sheet and chuck placed on pt. Pt then walked to toilet and had another BM. Pt underwear and pants taken off and placed in pt belongings bag. Clean brief placed on pt. Pt helped back into bed and set back up in cardiac monitor and covered with blankets. Pt denied any needs at this time.

## 2016-11-25 NOTE — ED Notes (Signed)
Hat placed in toilet to collect stool sample when pt needs to defecate.

## 2016-11-25 NOTE — ED Notes (Signed)
Pt first bag of normal saline hanging. Pt sitting up on stretcher wrapped in blankets. Denies any needs at this time.

## 2016-11-25 NOTE — ED Triage Notes (Signed)
Pt presents from [facility] via [facility] for weakness, N&V&D. Pt denies vomiting today, states "every time I move I have diarrhea, it happens during the night." Denies blood in stool. PT is alert and oriented upon arrival. Diet controlled diabetic, CBG 238. Denies SOB and CP. Denies abd pain at present. No fever.

## 2016-11-25 NOTE — ED Provider Notes (Signed)
[facility] Emergency Department Provider Note  ____________________________________________  Time seen: Approximately 6:53 PM  I have reviewed the triage vital signs and the nursing notes.   HISTORY  Chief Complaint Weakness; Emesis; and Diarrhea   HPI Derek Guerra is a 83 y.o. male with a history of prostate cancer in remission, HTN, chronic kidney diseasewho presents for evaluation of generalized weakness and nausea and diarrhea. Patient has had 4 days of several daily episodes of watery diarrhea with no melena or blood. He endorses 6-8 episodes daily. Has had significant nausea associated with it. He denies vomiting, fever, chills, abdominal pain, dysuria, hematuria, chest pain or shortness of breath, lightheadedness. Patient is on chronic Macrobid since having prostate cancer many years ago to prevent recurrent urinary tract infections. Has not been on any other antibiotics recently. No history of C. difficile. Endorses generalized weakness.   Past Medical History:  Diagnosis Date  . Chronic kidney disease   . Diabetes mellitus without complication ([place])   . Prostate cancer [facility])     Patient Active Problem List   Diagnosis Date Noted  . Enteritis 11/26/2016  . Unsteadiness 08/22/2015  . Beat, premature ventricular 02/14/2015  . Benign essential HTN 02/02/2015  . Bladder infection, chronic 10/10/2014  . Incomplete bladder emptying 10/10/2014  . Atherosclerosis of aorta ([place]) 08/14/2014  . Diabetes mellitus ([place]) 08/14/2014  . History of cardiac catheterization 08/14/2014  . Left ventricular dysfunction 08/14/2014  . Combined fat and carbohydrate induced hyperlipemia 08/14/2014  . Peripheral vascular disease ([place]) 08/14/2014  . Disorder of kidney and ureter 08/14/2014  . Type 2 diabetes mellitus with diabetic chronic kidney disease ([place]) 03/27/2014  . Acontractile bladder 03/23/2014  . H/O malignant neoplasm of prostate 03/20/2014  . BP (high  blood pressure) 03/10/2013  . Type 2 diabetes mellitus ([place]) 02/16/2013  . Abnormal gait 02/25/2011  . Cannot sleep 11/21/2010    Past Surgical History:  Procedure Laterality Date  . PROSTATE SURGERY      Prior to Admission medications   Medication Sig Start Date End Date Taking? Authorizing Provider  amitriptyline (ELAVIL) 10 MG tablet Take 20 mg by mouth at bedtime.  04/28/16  Yes Historical Provider, MD  aspirin 81 MG tablet Take 81 mg by mouth daily.   Yes Historical Provider, MD  enalapril (VASOTEC) 10 MG tablet Take 10 mg by mouth daily.  11/23/09  Yes Historical Provider, MD  linagliptin (TRADJENTA) 5 MG TABS tablet Take 5 mg by mouth daily.    Yes Historical Provider, MD  nitrofurantoin (MACRODANTIN) 50 MG capsule Take 50 mg by mouth daily before breakfast.    Yes Historical Provider, MD  rosuvastatin (CRESTOR) 5 MG tablet Take 5 mg by mouth See admin instructions. THREE TIMES WEEKLY, (Monday, Wednesday AND Friday) 08/08/16  Yes Historical Provider, MD  tamsulosin (FLOMAX) 0.4 MG CAPS capsule Take 0.4 mg by mouth daily after breakfast.  06/12/15  Yes Historical Provider, MD    Allergies Gemfibrozil; Niacin; Nifedipine; and Simvastatin  Family History  Problem Relation Age of Onset  . Hypertension Neg Hx   . Diabetes Neg Hx     Social History Social History  Substance Use Topics  . Smoking status: Never Smoker  . Smokeless tobacco: Never Used  . Alcohol use No    Review of Systems  Constitutional: Negative for fever. + Generalized weakness Eyes: Negative for visual changes. ENT: Negative for sore throat. Neck: No neck pain  Cardiovascular: Negative for chest pain. Respiratory: Negative for shortness  of breath. Gastrointestinal: Negative for abdominal pain, vomiting. + Nausea, and diarrhea. Genitourinary: Negative for dysuria. Musculoskeletal: Negative for back pain. Skin: Negative for rash. Neurological: Negative for headaches, weakness or numbness. Psych: No SI  or HI  ____________________________________________   PHYSICAL EXAM:  VITAL SIGNS: ED Triage Vitals  Enc Vitals Group     BP 11/25/16 1722 92/67     Pulse Rate 11/25/16 1730 79     Resp 11/25/16 1722 17     Temp 11/25/16 1722 98.2 F (36.8 C)     Temp Source 11/25/16 1722 Oral     SpO2 11/25/16 1722 98 %     Weight 11/25/16 1722 140 lb (63.5 kg)     Height 11/25/16 1722 5\' 10"  (1.778 m)     Head Circumference --      Peak Flow --      Pain Score 11/25/16 1722 0     Pain Loc --      Pain Edu? --      Excl. in [place]? --     Constitutional: Alert and oriented. Well appearing and in no apparent distress. HEENT:      Head: Normocephalic and atraumatic.         Eyes: Conjunctivae are normal. Sclera is non-icteric. EOMI. PERRL      Mouth/Throat: Mucous membranes are moist.       Neck: Supple with no signs of meningismus. Cardiovascular: Regular rate and rhythm. No murmurs, gallops, or rubs. 2+ symmetrical distal pulses are present in all extremities. No JVD. Respiratory: Normal respiratory effort. Lungs are clear to auscultation bilaterally. No wheezes, crackles, or rhonchi.  Gastrointestinal: Soft, non tender, and non distended with positive bowel sounds. No rebound or guarding. Musculoskeletal: Nontender with normal range of motion in all extremities. No edema, cyanosis, or erythema of extremities. Neurologic: Normal speech and language. Face is symmetric. Moving all extremities. No gross focal neurologic deficits are appreciated. Skin: Skin is warm, dry and intact. No rash noted. Psychiatric: Mood and affect are normal. Speech and behavior are normal.  ____________________________________________   LABS (all labs ordered are listed, but only abnormal results are displayed)  Labs Reviewed  BASIC METABOLIC PANEL - Abnormal; Notable for the following:       Result Value   Sodium 134 (*)    Glucose, Bld 238 (*)    BUN 38 (*)    Creatinine, Ser 2.32 (*)    GFR calc non Af  Amer 25 (*)    GFR calc Af Amer 28 (*)    All other components within normal limits  CBC - Abnormal; Notable for the following:    RBC 4.28 (*)    Platelets 131 (*)    All other components within normal limits  URINALYSIS, COMPLETE (UACMP) WITH MICROSCOPIC - Abnormal; Notable for the following:    Color, Urine YELLOW (*)    APPearance CLEAR (*)    Glucose, UA 50 (*)    Hgb urine dipstick SMALL (*)    Bacteria, UA RARE (*)    Squamous Epithelial / LPF 0-5 (*)    All other components within normal limits  BASIC METABOLIC PANEL - Abnormal; Notable for the following:    Glucose, Bld 162 (*)    BUN 36 (*)    Creatinine, Ser 2.08 (*)    Calcium 8.1 (*)    GFR calc non Af Amer 28 (*)    GFR calc Af Amer 32 (*)    Anion gap 4 (*)  All other components within normal limits  BASIC METABOLIC PANEL - Abnormal; Notable for the following:    CO2 21 (*)    Glucose, Bld 176 (*)    BUN 35 (*)    Creatinine, Ser 1.95 (*)    Calcium 8.3 (*)    GFR calc non Af Amer 30 (*)    GFR calc Af Amer 35 (*)    All other components within normal limits  CBC - Abnormal; Notable for the following:    WBC 3.3 (*)    RBC 4.07 (*)    HCT 39.6 (*)    Platelets 117 (*)    All other components within normal limits  BETA-HYDROXYBUTYRIC ACID - Abnormal; Notable for the following:    Beta-Hydroxybutyric Acid 0.89 (*)    All other components within normal limits  GLUCOSE, CAPILLARY - Abnormal; Notable for the following:    Glucose-Capillary 161 (*)    All other components within normal limits  GLUCOSE, CAPILLARY - Abnormal; Notable for the following:    Glucose-Capillary 194 (*)    All other components within normal limits  C DIFFICILE QUICK SCREEN W PCR REFLEX  GASTROINTESTINAL PANEL BY PCR, STOOL (REPLACES STOOL CULTURE)  MAGNESIUM  LACTIC ACID, PLASMA  GLUCOSE, CAPILLARY  HEMOGLOBIN A1C  COMPREHENSIVE METABOLIC PANEL  CBC   ____________________________________________  EKG  ED ECG  REPORT I, Rudene Re, the attending physician, personally viewed and interpreted this ECG.  Normal sinus rhythm, rate of 88, first-degree AV block, normal QRS and QTc intervals, left axis deviation, no ST elevations or depressions. ____________________________________________  RADIOLOGY  none  ____________________________________________   PROCEDURES  Procedure(s) performed: None Procedures Critical Care performed:  None ____________________________________________   INITIAL IMPRESSION / ASSESSMENT AND PLAN / ED COURSE  83 y.o. male with a history of prostate cancer in remission, HTN, chronic kidney diseasewho presents for evaluation of generalized weakness and nausea and several daily episodes of watery diarrhea 4 days. Patient is well-appearing, in no distress, no tachycardia or fever, initially mildly hypotensive with a BP of 92/67, abdomen is soft with no tenderness throughout. Labs showing acute on chronic kidney disease with a creatinine of 2.32 (baseline 1.8 - 2.0). Normal electrolytes. Will send stool for C. Diff. Will give IVF and IV zofran. Will hydrate and repeat kidney function if improving and patient tolerating PO will plan to DC home.  Clinical Course as of Nov 26 2225  Tue Nov 25, 2016  2009 Pending repeat BMP after IVF. If creatinine is improving will DC home otherwise will admit for hydration. Care transferred to Dr. Joni Fears  [CV]    Clinical Course User Index [CV] Rudene Re, MD    Pertinent labs & imaging results that were available during my care of the patient were reviewed by me and considered in my medical decision making (see chart for details).    ____________________________________________   FINAL CLINICAL IMPRESSION(S) / ED DIAGNOSES  Final diagnoses:  AKI (acute kidney injury) ([place])  Diarrhea of presumed infectious origin      NEW MEDICATIONS STARTED DURING THIS VISIT:  Current Discharge Medication List       Note:   This document was prepared using Dragon voice recognition software and may include unintentional dictation errors.    Rudene Re, MD 11/26/16 2227

## 2016-11-25 NOTE — ED Notes (Signed)
PT states whenever he eats it just goes right through him. States diarrhea with movement. States nausea but denies vomiting.

## 2016-11-26 ENCOUNTER — Encounter: Payer: Self-pay | Admitting: Internal Medicine

## 2016-11-26 ENCOUNTER — Observation Stay: Payer: Medicare Other

## 2016-11-26 DIAGNOSIS — K529 Noninfective gastroenteritis and colitis, unspecified: Secondary | ICD-10-CM | POA: Diagnosis not present

## 2016-11-26 LAB — CBC
HCT: 39.6 % — ABNORMAL LOW (ref 40.0–52.0)
Hemoglobin: 13.4 g/dL (ref 13.0–18.0)
MCH: 33 pg (ref 26.0–34.0)
MCHC: 33.9 g/dL (ref 32.0–36.0)
MCV: 97.2 fL (ref 80.0–100.0)
PLATELETS: 117 10*3/uL — AB (ref 150–440)
RBC: 4.07 MIL/uL — ABNORMAL LOW (ref 4.40–5.90)
RDW: 13.5 % (ref 11.5–14.5)
WBC: 3.3 10*3/uL — ABNORMAL LOW (ref 3.8–10.6)

## 2016-11-26 LAB — GLUCOSE, CAPILLARY
Glucose-Capillary: 161 mg/dL — ABNORMAL HIGH (ref 65–99)
Glucose-Capillary: 82 mg/dL (ref 65–99)
Glucose-Capillary: 194 mg/dL — ABNORMAL HIGH (ref 65–99)

## 2016-11-26 LAB — GASTROINTESTINAL PANEL BY PCR, STOOL (REPLACES STOOL CULTURE)
Adenovirus F40/41: NOT DETECTED
Astrovirus: NOT DETECTED
CRYPTOSPORIDIUM: NOT DETECTED
CYCLOSPORA CAYETANENSIS: NOT DETECTED
Campylobacter species: NOT DETECTED
ENTAMOEBA HISTOLYTICA: NOT DETECTED
ENTEROAGGREGATIVE E COLI (EAEC): NOT DETECTED
ENTEROPATHOGENIC E COLI (EPEC): NOT DETECTED
ENTEROTOXIGENIC E COLI (ETEC): NOT DETECTED
GIARDIA LAMBLIA: NOT DETECTED
NOROVIRUS GI/GII: NOT DETECTED
Plesimonas shigelloides: NOT DETECTED
Rotavirus A: NOT DETECTED
SAPOVIRUS (I, II, IV, AND V): NOT DETECTED
Salmonella species: NOT DETECTED
Shiga like toxin producing E coli (STEC): NOT DETECTED
Shigella/Enteroinvasive E coli (EIEC): NOT DETECTED
VIBRIO CHOLERAE: NOT DETECTED
Vibrio species: NOT DETECTED
Yersinia enterocolitica: NOT DETECTED

## 2016-11-26 LAB — BASIC METABOLIC PANEL
ANION GAP: 4 — AB (ref 5–15)
Anion gap: 7 (ref 5–15)
BUN: 36 mg/dL — ABNORMAL HIGH (ref 6–20)
BUN: 35 mg/dL — ABNORMAL HIGH (ref 6–20)
CALCIUM: 8.3 mg/dL — AB (ref 8.9–10.3)
CALCIUM: 8.1 mg/dL — AB (ref 8.9–10.3)
CHLORIDE: 111 mmol/L (ref 101–111)
CO2: 21 mmol/L — AB (ref 22–32)
CO2: 23 mmol/L (ref 22–32)
CREATININE: 1.95 mg/dL — AB (ref 0.61–1.24)
Chloride: 109 mmol/L (ref 101–111)
Creatinine, Ser: 2.08 mg/dL — ABNORMAL HIGH (ref 0.61–1.24)
GFR calc Af Amer: 32 mL/min — ABNORMAL LOW (ref >60)
GFR calc Af Amer: 35 mL/min — ABNORMAL LOW (ref >60)
GFR calc non Af Amer: 28 mL/min — ABNORMAL LOW (ref >60)
GFR calc non Af Amer: 30 mL/min — ABNORMAL LOW (ref >60)
GLUCOSE: 176 mg/dL — AB (ref 65–99)
GLUCOSE: 162 mg/dL — AB (ref 65–99)
Potassium: 4.7 mmol/L (ref 3.5–5.1)
Potassium: 4.4 mmol/L (ref 3.5–5.1)
Sodium: 138 mmol/L (ref 135–145)
Sodium: 137 mmol/L (ref 135–145)

## 2016-11-26 LAB — URINALYSIS, COMPLETE (UACMP) WITH MICROSCOPIC
Bilirubin Urine: NEGATIVE
GLUCOSE, UA: 50 mg/dL — AB
KETONES UR: NEGATIVE mg/dL
LEUKOCYTES UA: NEGATIVE
Nitrite: NEGATIVE
PH: 5 (ref 5.0–8.0)
Protein, ur: NEGATIVE mg/dL
SPECIFIC GRAVITY, URINE: 1.012 (ref 1.005–1.030)

## 2016-11-26 LAB — LACTIC ACID, PLASMA: LACTIC ACID, VENOUS: 1.4 mmol/L (ref 0.5–1.9)

## 2016-11-26 LAB — BETA-HYDROXYBUTYRIC ACID: Beta-Hydroxybutyric Acid: 0.89 mmol/L — ABNORMAL HIGH (ref 0.05–0.27)

## 2016-11-26 MED ORDER — HEPARIN SODIUM (PORCINE) 5000 UNIT/ML IJ SOLN
5000.0000 [IU] | Freq: Three times a day (TID) | INTRAMUSCULAR | Status: DC
  Administered 2016-11-26 – 2016-11-27 (×5): 5000 [IU] via SUBCUTANEOUS
  Filled 2016-11-26 (×5): qty 1

## 2016-11-26 MED ORDER — AMITRIPTYLINE HCL 10 MG PO TABS
20.0000 mg | ORAL_TABLET | Freq: Every day | ORAL | Status: DC
  Administered 2016-11-26 – 2016-11-27 (×2): 20 mg via ORAL
  Filled 2016-11-26 (×3): qty 2

## 2016-11-26 MED ORDER — METOCLOPRAMIDE HCL 5 MG PO TABS
5.0000 mg | ORAL_TABLET | Freq: Three times a day (TID) | ORAL | Status: DC
  Administered 2016-11-26 – 2016-11-27 (×3): 5 mg via ORAL
  Filled 2016-11-26 (×4): qty 1

## 2016-11-26 MED ORDER — ONDANSETRON HCL 4 MG/2ML IJ SOLN: 4.0000 mg | Freq: Four times a day (QID) | INTRAMUSCULAR | Status: DC | PRN

## 2016-11-26 MED ORDER — INSULIN ASPART 100 UNIT/ML ~~LOC~~ SOLN
0.0000 [IU] | Freq: Three times a day (TID) | SUBCUTANEOUS | Status: DC
  Administered 2016-11-26: 2 [IU] via SUBCUTANEOUS
  Administered 2016-11-27 – 2016-11-28 (×3): 1 [IU] via SUBCUTANEOUS
  Filled 2016-11-26: qty 1
  Filled 2016-11-26: qty 2
  Filled 2016-11-26: qty 1

## 2016-11-26 MED ORDER — ACETAMINOPHEN 325 MG PO TABS: 650.0000 mg | ORAL_TABLET | Freq: Four times a day (QID) | ORAL | Status: DC | PRN

## 2016-11-26 MED ORDER — INSULIN ASPART 100 UNIT/ML ~~LOC~~ SOLN
3.0000 [IU] | Freq: Three times a day (TID) | SUBCUTANEOUS | Status: DC
  Administered 2016-11-26 – 2016-11-28 (×5): 3 [IU] via SUBCUTANEOUS
  Filled 2016-11-26 (×5): qty 3

## 2016-11-26 MED ORDER — ONDANSETRON HCL 4 MG PO TABS: 4.0000 mg | ORAL_TABLET | Freq: Four times a day (QID) | ORAL | Status: DC | PRN

## 2016-11-26 MED ORDER — INSULIN ASPART 100 UNIT/ML ~~LOC~~ SOLN
0.0000 [IU] | Freq: Every day | SUBCUTANEOUS | Status: DC
  Filled 2016-11-26: qty 1

## 2016-11-26 MED ORDER — ASPIRIN 81 MG PO CHEW
81.0000 mg | CHEWABLE_TABLET | Freq: Every day | ORAL | Status: DC
  Administered 2016-11-26 – 2016-11-28 (×3): 81 mg via ORAL
  Filled 2016-11-26 (×3): qty 1

## 2016-11-26 MED ORDER — ACETAMINOPHEN 650 MG RE SUPP: 650.0000 mg | Freq: Four times a day (QID) | RECTAL | Status: DC | PRN

## 2016-11-26 MED ORDER — SODIUM CHLORIDE 0.9 % IV SOLN
INTRAVENOUS | Status: DC
  Administered 2016-11-26 – 2016-11-27 (×4): via INTRAVENOUS

## 2016-11-26 MED ORDER — CHOLESTYRAMINE LIGHT 4 G PO PACK
4.0000 g | PACK | Freq: Two times a day (BID) | ORAL | Status: DC
  Administered 2016-11-26 – 2016-11-28 (×4): 4 g via ORAL
  Filled 2016-11-26 (×6): qty 1

## 2016-11-26 MED ORDER — TAMSULOSIN HCL 0.4 MG PO CAPS
0.4000 mg | ORAL_CAPSULE | Freq: Every day | ORAL | Status: DC
  Administered 2016-11-26: 0.4 mg via ORAL
  Filled 2016-11-26: qty 1

## 2016-11-26 MED ORDER — PANTOPRAZOLE SODIUM 40 MG PO TBEC
40.0000 mg | DELAYED_RELEASE_TABLET | Freq: Every day | ORAL | Status: DC
  Administered 2016-11-26 – 2016-11-28 (×3): 40 mg via ORAL
  Filled 2016-11-26 (×3): qty 1

## 2016-11-26 NOTE — ED Notes (Signed)
Pt resting on stretcher with eyes closed and even respirations. No increased work of breathing or acute distress noted at this time. Lights turned off to enhance rest. Call bell within reach and side rails up for safety. Will continue to assess.

## 2016-11-26 NOTE — Care Management Obs Status (Signed)
[place] NOTIFICATION   Patient Details  Name: Derek Guerra MRN: [id number] Date of Birth: Jul 27, 1932   Medicare Observation Status Notification Given:  Yes    Beverly Sessions, RN 11/26/2016, 4:28 PM

## 2016-11-26 NOTE — ED Notes (Signed)
Changed patient and brief.

## 2016-11-26 NOTE — Progress Notes (Signed)
Inpatient Diabetes Program Recommendations  AACE/ADA: New Consensus Statement on Inpatient Glycemic Control (2015)  Target Ranges:  Prepandial:   less than 140 mg/dL      Peak postprandial:   less than 180 mg/dL (1-2 hours)      Critically ill patients:  140 - 180 mg/dL   Results for FREEMON, BINFORD (MRN [id number]) as of 11/26/2016 11:19  Ref. Range 11/25/2016 17:20 11/26/2016 00:35 11/26/2016 07:05  Glucose Latest Ref Range: 65 - 99 mg/dL 238 (H) 162 (H) 176 (H)   Review of Glycemic Control  Diabetes history: DM2 Outpatient Diabetes medications: Tradjenta 5 mg daily Current orders for Inpatient glycemic control: None  Inpatient Diabetes Program Recommendations: Correction (SSI): While inpatient, please consider ordering CBGs with Novolog 0-9 units TID with meals and Novolog 0-5 units QHS.  Thanks, Barnie Alderman, RN, MSN, CDE Diabetes Coordinator Inpatient Diabetes Program [phone] (Team Pager from 8am to 5pm)

## 2016-11-26 NOTE — Progress Notes (Signed)
[place] [facility] [employer] NAME: Derek Guerra    MR#:  [id number]  DATE OF BIRTH:  Aug 21, 1932  SUBJECTIVE:  CHIEF COMPLAINT:   Chief Complaint  Patient presents with  . Weakness  . Emesis  . Diarrhea  The patient is a 83 year old Caucasian male with past medical history significant for history of CAD, diabetes mellitus, prostate cancer, who currently presents to the hospital with complaints of watery diarrhea for the past 4 days, dry heaving, nausea, mild abdominal pain. Apparently 8, smokes, 4 days ago and became sick. He was also found to be weak. Stool cultures for enteric pathogens, C. difficile were negative. Hospitalist services were contacted for admission. Patient complains of mild abdominal discomfort diffusely, some aversion to food, nausea but no vomiting. He continues to have watery diarrhea every 1 hour   Review of Systems  Constitutional: Negative for chills, fever and weight loss.  HENT: Negative for congestion.   Eyes: Negative for blurred vision and double vision.  Respiratory: Negative for cough, sputum production, shortness of breath and wheezing.   Cardiovascular: Negative for chest pain, palpitations, orthopnea, leg swelling and PND.  Gastrointestinal: Positive for abdominal pain, diarrhea and nausea. Negative for blood in stool, constipation and vomiting.  Genitourinary: Negative for dysuria, frequency, hematuria and urgency.  Musculoskeletal: Negative for falls.  Neurological: Negative for dizziness, tremors, focal weakness and headaches.  Endo/Heme/Allergies: Does not bruise/bleed easily.  Psychiatric/Behavioral: Negative for depression. The patient does not have insomnia.     VITAL SIGNS: Blood pressure 138/73, pulse 77, temperature 98.2 F (36.8 C), temperature source Oral, resp. rate 20, height 5\' 10"  (1.778 m), weight 63.5 kg (140 lb), SpO2 97 %.  PHYSICAL EXAMINATION:   GENERAL:  83 y.o.-year-old patient  lying in the bed with no acute distress.  EYES: Pupils equal, round, reactive to light and accommodation. No scleral icterus. Extraocular muscles intact. Heart hearing HEENT: Head atraumatic, normocephalic. Oropharynx and nasopharynx clear.  NECK:  Supple, no jugular venous distention. No thyroid enlargement, no tenderness.  LUNGS: Normal breath sounds bilaterally, no wheezing, rales,rhonchi or crepitation. No use of accessory muscles of respiration.  CARDIOVASCULAR: S1, S2 normal. No murmurs, rubs, or gallops.  ABDOMEN: Soft, mild discomfort on palpation diffusely, mostly in the epigastric area, no rebound or guarding, nondistended. Bowel sounds present, diminished. No organomegaly or mass.  EXTREMITIES: No pedal edema, cyanosis, or clubbing.  NEUROLOGIC: Cranial nerves II through XII are intact. Muscle strength 5/5 in all extremities. Sensation intact. Gait not checked.  PSYCHIATRIC: The patient is alert and oriented x 3.  SKIN: No obvious rash, lesion, or ulcer.   ORDERS/RESULTS REVIEWED:   CBC  Recent Labs Lab 11/25/16 1720 11/26/16 0705  WBC 6.9 3.3*  HGB 14.3 13.4  HCT 41.8 39.6*  PLT 131* 117*  MCV 97.8 97.2  MCH 33.4 33.0  MCHC 34.1 33.9  RDW 13.3 13.5   ------------------------------------------------------------------------------------------------------------------  Chemistries   Recent Labs Lab 11/25/16 1720 11/26/16 0035 11/26/16 0705  NA 134* 138 137  K 4.4 4.7 4.4  CL 102 111 109  CO2 23 23 21*  GLUCOSE 238* 162* 176*  BUN 38* 36* 35*  CREATININE 2.32* 2.08* 1.95*  CALCIUM 8.9 8.1* 8.3*  MG 1.9  --   --    ------------------------------------------------------------------------------------------------------------------ estimated creatinine clearance is 26.2 mL/min (A) (by C-G formula based on SCr of 1.95 mg/dL (H)). ------------------------------------------------------------------------------------------------------------------ No results for  input(s): TSH, T4TOTAL, T3FREE, THYROIDAB in the last 72 hours.  Invalid input(s): FREET3  Cardiac Enzymes No results for input(s): CKMB, TROPONINI, MYOGLOBIN in the last 168 hours.  Invalid input(s): CK ------------------------------------------------------------------------------------------------------------------ Invalid input(s): POCBNP ---------------------------------------------------------------------------------------------------------------  RADIOLOGY: Dg Abd 1 View  Result Date: 11/26/2016 CLINICAL DATA:  Weakness, emesis, diarrhea for 4 days. EXAM: ABDOMEN - 1 VIEW COMPARISON:  None. FINDINGS: There is distention of small and large bowel loops. Postop changes project over the pelvis. Descending colon is relatively decompressed. No obvious free intraperitoneal gas. Severe degenerative change of the left hip joint. IMPRESSION: Generalized bowel distension most consistent with a mild ileus pattern. Descending colon is relatively decompressed. Electronically Signed   By: Marybelle Killings M.D.   On: 11/26/2016 11:56    EKG:  Orders placed or performed during the hospital encounter of 11/25/16  . ED EKG  . ED EKG  . EKG 12-Lead  . EKG 12-Lead    ASSESSMENT AND PLAN:  Active Problems:   Enteritis  #1. Acute enteritis of unclear etiology at this time, likely toxic, continue supportive therapy, follow clinically, downgrade diet to full liquids #2. Ileus, initiate Reglan #3. Acute on chronic renal failure, baseline GFR of 30,, improving, close to baseline #4. Acidosis, mild DKA, initiate patient on sliding scale insulin, continue full liquid diet #5. Leukopenia, recheck CBC in the morning #6 thrombocytopenia, follow in the morning, etiology is unclear, could be related to current acute illness #7. Generalized weakness, get physical therapist involved Management plans discussed with the patient, family and they are in agreement.   DRUG ALLERGIES:  Allergies  Allergen Reactions   . Gemfibrozil Other (See Comments)    Other reaction(s): MUSCLE PAIN Other reaction(s): MUSCLE PAIN  . Niacin Other (See Comments)    Other reaction(s): MUSCLE PAIN Other reaction(s): MUSCLE PAIN  . Nifedipine Other (See Comments)    Other reaction(s): MUSCLE PAIN Other reaction(s): MUSCLE PAIN  . Simvastatin Other (See Comments)    Other reaction(s): MUSCLE PAIN Other reaction(s): MUSCLE PAIN    CODE STATUS:     Code Status Orders        Start     Ordered   11/26/16 0348  Full code  Continuous     11/26/16 0348    Code Status History    Date Active Date Inactive Code Status Order ID Comments User Context   This patient has a current code status but no historical code status.    Advance Directive Documentation     Most Recent Value  Type of Advance Directive  Living will  Pre-existing out of facility DNR order (yellow form or pink MOST form)  -  "MOST" Form in Place?  -      TOTAL TIME TAKING CARE OF THIS PATIENT: 40 minutes.    Theodoro Grist M.D on 11/26/2016 at 2:55 PM  Between 7am to 6pm - Pager - [phone]  After 6pm go to [email or web address] - password [email or web address] [facility]  Office  [phone]  CC: Primary care physician; Glean Salvo, MD

## 2016-11-26 NOTE — ED Notes (Signed)
Pt assisted to bedside toilet to collect urine and stool sample. Large watery stool noted in pt depends. Helped clean pt and changed bed linens. Pt voided a moderate unmeasured amount in addition to having watery stool combined with several small formed stool. Unable to collect stool for lab due to urine contamination. Pt given call bell and instructed to call the next time he is able to have a bowel movement so we are able to send a sample to the lab. Pt verbalizes understanding.

## 2016-11-26 NOTE — H&P (Signed)
[facility] [place] NAME: Derek Guerra    MR#:  [id number]  DATE OF BIRTH:  08/07/1932  DATE OF ADMISSION:  11/25/2016  PRIMARY CARE PHYSICIAN: Glean Salvo, MD   REQUESTING/REFERRING PHYSICIAN:   CHIEF COMPLAINT:   Chief Complaint  Patient presents with  . Weakness  . Emesis  . Diarrhea    HISTORY OF PRESENT ILLNESS: Derek Guerra  is a 83 y.o. male with a known history of Chronic kidney disease, type 2 diabetes mellitus, prostate cancer presented to the emergency room with diarrhea for 4 days. The diarrhea is watery stool but no blood. Has nausea and dry heaves but no vomiting. Has mild abdominal discomfort aching in nature 3 out of 10 on a scale of 1-10. Patient ate smoked salmon 4 days ago. No history of sick contacts at home or recent travel. No complaints of any chest pain, shortness of breath and palpitations. Patient appears dry and dehydrated. He was started on IV fluids in the emergency room but was still feeling weak and still had voluminous diarrhea. Stools C. difficile toxin was negative. Hospitalist service was consulted for further care of the patient.  PAST MEDICAL HISTORY:   Past Medical History:  Diagnosis Date  . Chronic kidney disease   . Diabetes mellitus without complication ([place])   . Prostate cancer ([place])     PAST SURGICAL HISTORY: Past Surgical History:  Procedure Laterality Date  . PROSTATE SURGERY      SOCIAL HISTORY:  Social History  Substance Use Topics  . Smoking status: Never Smoker  . Smokeless tobacco: Never Used  . Alcohol use No    FAMILY HISTORY:  Family History  Problem Relation Age of Onset  . Hypertension Neg Hx   . Diabetes Neg Hx     DRUG ALLERGIES:  Allergies  Allergen Reactions  . Gemfibrozil Other (See Comments)    Other reaction(s): MUSCLE PAIN Other reaction(s): MUSCLE PAIN  . Niacin Other (See Comments)    Other reaction(s): MUSCLE PAIN Other reaction(s): MUSCLE PAIN   . Nifedipine Other (See Comments)    Other reaction(s): MUSCLE PAIN Other reaction(s): MUSCLE PAIN  . Simvastatin Other (See Comments)    Other reaction(s): MUSCLE PAIN Other reaction(s): MUSCLE PAIN    REVIEW OF SYSTEMS:   CONSTITUTIONAL: No fever, has weakness.  EYES: No blurred or double vision.  EARS, NOSE, AND THROAT: No tinnitus or ear pain.  RESPIRATORY: No cough, shortness of breath, wheezing or hemoptysis.  CARDIOVASCULAR: No chest pain, orthopnea, edema.  GASTROINTESTINAL: Has nausea,  diarrhea and abdominal pain.  No vomiting GENITOURINARY: No dysuria, hematuria.  ENDOCRINE: No polyuria, nocturia,  HEMATOLOGY: No anemia, easy bruising or bleeding SKIN: No rash or lesion. MUSCULOSKELETAL: No joint pain or arthritis.   NEUROLOGIC: No tingling, numbness, weakness.  PSYCHIATRY: No anxiety or depression.   MEDICATIONS AT HOME:  Prior to Admission medications   Medication Sig Start Date End Date Taking? Authorizing Provider  amitriptyline (ELAVIL) 10 MG tablet Take 20 mg by mouth at bedtime.  04/28/16  Yes Historical Provider, MD  aspirin 81 MG tablet Take 81 mg by mouth daily.   Yes Historical Provider, MD  enalapril (VASOTEC) 10 MG tablet Take 10 mg by mouth daily.  11/23/09  Yes Historical Provider, MD  linagliptin (TRADJENTA) 5 MG TABS tablet Take 5 mg by mouth daily.    Yes Historical Provider, MD  nitrofurantoin (MACRODANTIN) 50 MG capsule Take 50 mg by mouth daily before breakfast.  Yes Historical Provider, MD  rosuvastatin (CRESTOR) 5 MG tablet Take 5 mg by mouth See admin instructions. THREE TIMES WEEKLY, (Monday, Wednesday AND Friday) 08/08/16  Yes Historical Provider, MD  tamsulosin (FLOMAX) 0.4 MG CAPS capsule Take 0.4 mg by mouth daily after breakfast.  06/12/15  Yes Historical Provider, MD      PHYSICAL EXAMINATION:   VITAL SIGNS: Blood pressure 119/63, pulse 74, temperature 98.2 F (36.8 C), temperature source Oral, resp. rate (!) 24, height 5\' 10"  (1.778  m), weight 63.5 kg (140 lb), SpO2 95 %.  GENERAL:  83 y.o.-year-old patient lying in the bed with no acute distress.  EYES: Pupils equal, round, reactive to light and accommodation. No scleral icterus. Extraocular muscles intact.  HEENT: Head atraumatic, normocephalic. Oropharynx dry and nasopharynx clear.  NECK:  Supple, no jugular venous distention. No thyroid enlargement, no tenderness.  LUNGS: Normal breath sounds bilaterally, no wheezing, rales,rhonchi or crepitation. No use of accessory muscles of respiration.  CARDIOVASCULAR: S1, S2 normal. No murmurs, rubs, or gallops.  ABDOMEN: Soft, mild tenderness around umbilicus, nondistended. Bowel sounds present. No organomegaly or mass.  EXTREMITIES: No pedal edema, cyanosis, or clubbing.  NEUROLOGIC: Cranial nerves II through XII are intact. Muscle strength 5/5 in all extremities. Sensation intact. Gait not checked.  PSYCHIATRIC: The patient is alert and oriented x 3.  SKIN: No obvious rash, lesion, or ulcer.   LABORATORY PANEL:   CBC  Recent Labs Lab 11/25/16 1720  WBC 6.9  HGB 14.3  HCT 41.8  PLT 131*  MCV 97.8  MCH 33.4  MCHC 34.1  RDW 13.3   ------------------------------------------------------------------------------------------------------------------  Chemistries   Recent Labs Lab 11/25/16 1720 11/26/16 0035  NA 134* 138  K 4.4 4.7  CL 102 111  CO2 23 23  GLUCOSE 238* 162*  BUN 38* 36*  CREATININE 2.32* 2.08*  CALCIUM 8.9 8.1*  MG 1.9  --    ------------------------------------------------------------------------------------------------------------------ estimated creatinine clearance is 24.6 mL/min (A) (by C-G formula based on SCr of 2.08 mg/dL (H)). ------------------------------------------------------------------------------------------------------------------ No results for input(s): TSH, T4TOTAL, T3FREE, THYROIDAB in the last 72 hours.  Invalid input(s): FREET3   Coagulation profile No results  for input(s): INR, PROTIME in the last 168 hours. ------------------------------------------------------------------------------------------------------------------- No results for input(s): DDIMER in the last 72 hours. -------------------------------------------------------------------------------------------------------------------  Cardiac Enzymes No results for input(s): CKMB, TROPONINI, MYOGLOBIN in the last 168 hours.  Invalid input(s): CK ------------------------------------------------------------------------------------------------------------------ Invalid input(s): POCBNP  ---------------------------------------------------------------------------------------------------------------  Urinalysis    Component Value Date/Time   COLORURINE YELLOW (A) 11/26/2016 0034   APPEARANCEUR CLEAR (A) 11/26/2016 0034   LABSPEC 1.012 11/26/2016 0034   PHURINE 5.0 11/26/2016 0034   GLUCOSEU 50 (A) 11/26/2016 0034   HGBUR SMALL (A) 11/26/2016 0034   BILIRUBINUR NEGATIVE 11/26/2016 0034   KETONESUR NEGATIVE 11/26/2016 0034   PROTEINUR NEGATIVE 11/26/2016 0034   NITRITE NEGATIVE 11/26/2016 0034   LEUKOCYTESUR NEGATIVE 11/26/2016 0034     RADIOLOGY: No results found.  EKG: Orders placed or performed during the hospital encounter of 11/25/16  . ED EKG  . ED EKG  . EKG 12-Lead  . EKG 12-Lead    IMPRESSION AND PLAN: 83 year old male patient with history of chronic kidney disease, type 2 diabetes mellitus, prostate cancer presented to the emergency room with weakness, diarrhea. Admitting diagnosis 1. Acute enteritis 2. Dehydration 3. Abdominal discomfort 4. Nausea 5. Chronic kidney disease 6. Prostate cancer Treatment plan Admit patient to medical floor IV fluid hydration Antiemetics for nausea Follow-up renal function Check stool culture Supportive care  All the records are reviewed and case discussed with ED provider. Management plans discussed with the patient, family  and they are in agreement.  CODE STATUS:FULL CODE Code Status History    This patient does not have a recorded code status. Please follow your organizational policy for patients in this situation.       TOTAL TIME TAKING CARE OF THIS PATIENT: 50 minutes.    Saundra Shelling M.D on 11/26/2016 at 3:21 AM  Between 7am to 6pm - Pager - [phone]  After 6pm go to [email or web address] - password [email or web address] [facility]  Office  [phone]  CC: Primary care physician; Glean Salvo, MD

## 2016-11-26 NOTE — ED Notes (Signed)
Pt assisted with changing his depends after he was incontinent of stool. Large amount of watery diarrhea noted. Barrier cream applied due to redness noted. Pt had a large watery bowel movement while this nurse was cleaning up pt on his stretcher. Pt tolerated well. Provided for comfort and safety and will continue to assess.

## 2016-11-26 NOTE — ED Notes (Signed)
Pt asking for cup of water. Provided per MD approval.

## 2016-11-26 NOTE — ED Provider Notes (Signed)
I assumed care of the patient from Dr. Joni Fears 73:58 PM. 83 year old male with profuse diarrhea who continues to have multiple episodes of diarrhea while in the room. Patient's creatinine 2.32 with a BUN of 38. C. difficile negative. Stool culture/PCR pending. Given patient's renal insufficiency with continued multiple episodes of diarrhea during my care of the patient he will be admitted to the hospital staff for diarrhea with dehydration renal insufficiency. Patient discussed with Dr.Pyreddy for hospital admission.   Gregor Hams, MD 11/26/16 [phone]

## 2016-11-26 NOTE — Clinical Social Work Note (Signed)
CSW not following yet as patient is from [place]. CSW did receive a call from the [facility] Worker: Jackey Loge: [phone] requesting an update. She also provided patient's family contact who lives in [place]: Renata Caprice: [phone]. CSW has updated RN CM as well because patient was open to home health. Shela Leff MSW,LCSW [phone]

## 2016-11-27 DIAGNOSIS — K529 Noninfective gastroenteritis and colitis, unspecified: Secondary | ICD-10-CM | POA: Diagnosis not present

## 2016-11-27 LAB — COMPREHENSIVE METABOLIC PANEL
ALBUMIN: 2.5 g/dL — AB (ref 3.5–5.0)
ALK PHOS: 33 U/L — AB (ref 38–126)
ALT: 9 U/L — AB (ref 17–63)
ANION GAP: 3 — AB (ref 5–15)
AST: 10 U/L — ABNORMAL LOW (ref 15–41)
BILIRUBIN TOTAL: 0.6 mg/dL (ref 0.3–1.2)
BUN: 27 mg/dL — ABNORMAL HIGH (ref 6–20)
CALCIUM: 8.2 mg/dL — AB (ref 8.9–10.3)
CO2: 21 mmol/L — AB (ref 22–32)
CREATININE: 1.86 mg/dL — AB (ref 0.61–1.24)
Chloride: 114 mmol/L — ABNORMAL HIGH (ref 101–111)
GFR calc non Af Amer: 32 mL/min — ABNORMAL LOW (ref >60)
GFR, EST AFRICAN AMERICAN: 37 mL/min — AB (ref >60)
GLUCOSE: 122 mg/dL — AB (ref 65–99)
Potassium: 4.2 mmol/L (ref 3.5–5.1)
Sodium: 138 mmol/L (ref 135–145)
TOTAL PROTEIN: 4.5 g/dL — AB (ref 6.5–8.1)

## 2016-11-27 LAB — CBC
HCT: 34.1 % — ABNORMAL LOW (ref 40.0–52.0)
Hemoglobin: 11.6 g/dL — ABNORMAL LOW (ref 13.0–18.0)
MCH: 32.7 pg (ref 26.0–34.0)
MCHC: 33.9 g/dL (ref 32.0–36.0)
MCV: 96.3 fL (ref 80.0–100.0)
PLATELETS: 111 10*3/uL — AB (ref 150–440)
RBC: 3.54 MIL/uL — ABNORMAL LOW (ref 4.40–5.90)
RDW: 13.1 % (ref 11.5–14.5)
WBC: 2.9 10*3/uL — ABNORMAL LOW (ref 3.8–10.6)

## 2016-11-27 LAB — HEMOGLOBIN A1C
Hgb A1c MFr Bld: 6.9 % — ABNORMAL HIGH (ref 4.8–5.6)
Mean Plasma Glucose: 151 mg/dL

## 2016-11-27 LAB — GLUCOSE, CAPILLARY
GLUCOSE-CAPILLARY: 128 mg/dL — AB (ref 65–99)
GLUCOSE-CAPILLARY: 142 mg/dL — AB (ref 65–99)
Glucose-Capillary: 120 mg/dL — ABNORMAL HIGH (ref 65–99)
Glucose-Capillary: 63 mg/dL — ABNORMAL LOW (ref 65–99)
Glucose-Capillary: 89 mg/dL (ref 65–99)

## 2016-11-27 MED ORDER — TAMSULOSIN HCL 0.4 MG PO CAPS
0.8000 mg | ORAL_CAPSULE | Freq: Every day | ORAL | Status: DC
  Administered 2016-11-27 – 2016-11-28 (×2): 0.8 mg via ORAL
  Filled 2016-11-27 (×2): qty 2

## 2016-11-27 MED ORDER — METOCLOPRAMIDE HCL 5 MG PO TABS: 5.0000 mg | ORAL_TABLET | Freq: Four times a day (QID) | ORAL | Status: DC | PRN

## 2016-11-27 MED ORDER — FINASTERIDE 5 MG PO TABS
5.0000 mg | ORAL_TABLET | Freq: Every day | ORAL | Status: DC
  Administered 2016-11-27 – 2016-11-28 (×2): 5 mg via ORAL
  Filled 2016-11-27 (×2): qty 1

## 2016-11-27 MED ORDER — LOPERAMIDE HCL 2 MG PO CAPS: 2.0000 mg | ORAL_CAPSULE | ORAL | Status: DC | PRN

## 2016-11-27 NOTE — Progress Notes (Signed)
[facility] at [place] NAME: Derek Guerra    MR#:  [id number]  DATE OF BIRTH:  [birth year]  SUBJECTIVE:  CHIEF COMPLAINT:   Chief Complaint  Patient presents with  . Weakness  . Emesis  . Diarrhea  The patient is a 83 year old Caucasian male with past medical history significant for history of CAD, diabetes mellitus, prostate cancer, who currently presents to the hospital with complaints of watery diarrhea for the past 4 days, dry heaving, nausea, mild abdominal pain. Apparently 8, smokes, 4 days ago and became sick. He was also found to be weak. Stool cultures for enteric pathogens, C. difficile were negative. Hospitalist services were contacted for admission. Patient was initiated on cholestyramine, PPIs, and improved. He denies any abdominal pain, has soft stool. His appetite has improved and he is able to eat full liquid diet. His diet has been advanced to soft, starting tonight. No bleeding labs have improved.     , Review of Systems  Constitutional: Negative for chills, fever and weight loss.  HENT: Negative for congestion.   Eyes: Negative for blurred vision and double vision.  Respiratory: Negative for cough, sputum production, shortness of breath and wheezing.   Cardiovascular: Negative for chest pain, palpitations, orthopnea, leg swelling and PND.  Gastrointestinal: Positive for abdominal pain, diarrhea and nausea. Negative for blood in stool, constipation and vomiting.  Genitourinary: Negative for dysuria, frequency, hematuria and urgency.  Musculoskeletal: Negative for falls.  Neurological: Negative for dizziness, tremors, focal weakness and headaches.  Endo/Heme/Allergies: Does not bruise/bleed easily.  Psychiatric/Behavioral: Negative for depression. The patient does not have insomnia.     VITAL SIGNS: Blood pressure (!) 104/58, pulse 64, temperature 97.8 F (36.6 C), temperature source Oral, resp. rate 20, height 5'  10" (1.778 m), weight 63.5 kg (140 lb), SpO2 96 %.  PHYSICAL EXAMINATION:   GENERAL:  83 y.o.-year-old patient lying in the bed with no acute distress.  EYES: Pupils equal, round, reactive to light and accommodation. No scleral icterus. Extraocular muscles intact. Heart hearing HEENT: Head atraumatic, normocephalic. Oropharynx and nasopharynx clear.  NECK:  Supple, no jugular venous distention. No thyroid enlargement, no tenderness.  LUNGS: Normal breath sounds bilaterally, no wheezing, rales,rhonchi or crepitation. No use of accessory muscles of respiration.  CARDIOVASCULAR: S1, S2 normal. No murmurs, rubs, or gallops.  ABDOMEN: Soft, , nontender, no rebound or guarding, nondistended. Bowel sounds present, diminished. No organomegaly or mass.  EXTREMITIES: No pedal edema, cyanosis, or clubbing.  NEUROLOGIC: Cranial nerves II through XII are intact. Muscle strength 5/5 in all extremities. Sensation intact. Gait not checked.  PSYCHIATRIC: The patient is alert and oriented x 3.  SKIN: No obvious rash, lesion, or ulcer.   ORDERS/RESULTS REVIEWED:   CBC  Recent Labs Lab 11/25/16 1720 11/26/16 0705 11/27/16 0633  WBC 6.9 3.3* 2.9*  HGB 14.3 13.4 11.6*  HCT 41.8 39.6* 34.1*  PLT 131* 117* 111*  MCV 97.8 97.2 96.3  MCH 33.4 33.0 32.7  MCHC 34.1 33.9 33.9  RDW 13.3 13.5 13.1   ------------------------------------------------------------------------------------------------------------------  Chemistries   Recent Labs Lab 11/25/16 1720 11/26/16 0035 11/26/16 0705 11/27/16 0633  NA 134* 138 137 138  K 4.4 4.7 4.4 4.2  CL 102 111 109 114*  CO2 23 23 21* 21*  GLUCOSE 238* 162* 176* 122*  BUN 38* 36* 35* 27*  CREATININE 2.32* 2.08* 1.95* 1.86*  CALCIUM 8.9 8.1* 8.3* 8.2*  MG 1.9  --   --   --  AST  --   --   --  10*  ALT  --   --   --  9*  ALKPHOS  --   --   --  33*  BILITOT  --   --   --  0.6    ------------------------------------------------------------------------------------------------------------------ estimated creatinine clearance is 27.5 mL/min (A) (by C-G formula based on SCr of 1.86 mg/dL (H)). ------------------------------------------------------------------------------------------------------------------ No results for input(s): TSH, T4TOTAL, T3FREE, THYROIDAB in the last 72 hours.  Invalid input(s): FREET3  Cardiac Enzymes No results for input(s): CKMB, TROPONINI, MYOGLOBIN in the last 168 hours.  Invalid input(s): CK ------------------------------------------------------------------------------------------------------------------ Invalid input(s): POCBNP ---------------------------------------------------------------------------------------------------------------  RADIOLOGY: Dg Abd 1 View  Result Date: 11/26/2016 CLINICAL DATA:  Weakness, emesis, diarrhea for 4 days. EXAM: ABDOMEN - 1 VIEW COMPARISON:  None. FINDINGS: There is distention of small and large bowel loops. Postop changes project over the pelvis. Descending colon is relatively decompressed. No obvious free intraperitoneal gas. Severe degenerative change of the left hip joint. IMPRESSION: Generalized bowel distension most consistent with a mild ileus pattern. Descending colon is relatively decompressed. Electronically Signed   By: Marybelle Killings M.D.   On: 11/26/2016 11:56    EKG:  Orders placed or performed during the hospital encounter of 11/25/16  . ED EKG  . ED EKG  . EKG 12-Lead  . EKG 12-Lead    ASSESSMENT AND PLAN:  Active Problems:   Enteritis  #1. Acute enteritis of unclear etiology at this time, likely toxic,, Improved clinically,  continue supportive therapy, PPI, advance diet to soft starting tonight, possible discharge home tomorrow  #2. ileus, continue Reglan as needed.  #3 Acute on chronic renal failure, baseline GFR of 30,, improving, at  baseline #4.  mild DKA, continue  sliding  scale insulin, . Blood glucose ranges between 80-130 . Hemoglobin A1c 6.9, good control #5. Leukopenia, . Still worsening, likely viral, recheck CBC in the morning #6 thrombocytopenia, follow in the morning, etiology is unclear, could be related to current acute illness #7. Generalized weakness, physical therapist saw patient in consultation and recommended home health services    Magement plans discussed with the patient, family and they are in agreement.   DRUG ALLERGIES:  Allergies  Allergen Reactions  . Gemfibrozil Other (See Comments)    Other reaction(s): MUSCLE PAIN Other reaction(s): MUSCLE PAIN  . Niacin Other (See Comments)    Other reaction(s): MUSCLE PAIN Other reaction(s): MUSCLE PAIN  . Nifedipine Other (See Comments)    Other reaction(s): MUSCLE PAIN Other reaction(s): MUSCLE PAIN  . Simvastatin Other (See Comments)    Other reaction(s): MUSCLE PAIN Other reaction(s): MUSCLE PAIN    CODE STATUS:     Code Status Orders        Start     Ordered   11/26/16 0348  Full code  Continuous     11/26/16 0348    Code Status History    Date Active Date Inactive Code Status Order ID Comments User Context   This patient has a current code status but no historical code status.    Advance Directive Documentation     Most Recent Value  Type of Advance Directive  Living will  Pre-existing out of facility DNR order (yellow form or pink MOST form)  -  "MOST" Form in Place?  -      TOTAL TIME TAKING CARE OF THIS PATIENT: 35 minutes.    Theodoro Grist M.D on 11/27/2016 at 3:14 PM  Between 7am to 6pm - Pager -  [phone]  After 6pm go to [email or web address] - password [email or web address] [facility]  Office  [phone]  CC: Primary care physician; Glean Salvo, MD

## 2016-11-27 NOTE — Progress Notes (Signed)
Bladder scan resulted in 588, this RN in and out cath patient with Derek Guerra, Nurse student. Output resulted in 575. Patient tolerated well.  Initially 400ML  came out, and with moderate positioning adjustment the following 174mL came out.

## 2016-11-27 NOTE — Progress Notes (Signed)
Several diarrhea stools overnight without noted voiding; only able to void 43ml this am; Bladder scanned with >556ml assessed; Dr. Ether Griffins paged, waiting call back. Barbaraann Faster, RN 7:10 AM 11/27/2016

## 2016-11-27 NOTE — Consult Note (Signed)
GI Inpatient Consult Note  Reason for [place]   Attending Requesting Consult:[place]  History of Present Illness: Derek Guerra is a 83 y.o. male who had onset of diarrhea a few days after eating salmon which was in his freezer for  3 weeks, he is unsure how long it was at the store.  Developed signif diarrhea without vomiting.  He is feeling much better today and not had  Diarrhea since this morning.  Labs suggest dehydration.  Past Medical History:  Past Medical History:  Diagnosis Date  . Chronic kidney disease   . Diabetes mellitus without complication ([place])   . Prostate cancer [facility])     Problem List: Patient Active Problem List   Diagnosis Date Noted  . Enteritis 11/26/2016  . Unsteadiness 08/22/2015  . Beat, premature ventricular 02/14/2015  . Benign essential HTN 02/02/2015  . Bladder infection, chronic 10/10/2014  . Incomplete bladder emptying 10/10/2014  . Atherosclerosis of aorta ([place]) 08/14/2014  . Diabetes mellitus ([place]) 08/14/2014  . History of cardiac catheterization 08/14/2014  . Left ventricular dysfunction 08/14/2014  . Combined fat and carbohydrate induced hyperlipemia 08/14/2014  . Peripheral vascular disease ([place]) 08/14/2014  . Disorder of kidney and ureter 08/14/2014  . Type 2 diabetes mellitus with diabetic chronic kidney disease ([place]) 03/27/2014  . Acontractile bladder 03/23/2014  . H/O malignant neoplasm of prostate 03/20/2014  . BP (high blood pressure) 03/10/2013  . Type 2 diabetes mellitus ([place]) 02/16/2013  . Abnormal gait 02/25/2011  . Cannot sleep 11/21/2010    Past Surgical History: Past Surgical History:  Procedure Laterality Date  . PROSTATE SURGERY      Allergies: Allergies  Allergen Reactions  . Gemfibrozil Other (See Comments)    Other reaction(s): MUSCLE PAIN Other reaction(s): MUSCLE PAIN  . Niacin Other (See Comments)    Other reaction(s): MUSCLE PAIN Other reaction(s): MUSCLE PAIN  . Nifedipine Other (See Comments)     Other reaction(s): MUSCLE PAIN Other reaction(s): MUSCLE PAIN  . Simvastatin Other (See Comments)    Other reaction(s): MUSCLE PAIN Other reaction(s): MUSCLE PAIN    Home Medications: Prescriptions Prior to Admission  Medication Sig Dispense Refill Last Dose  . amitriptyline (ELAVIL) 10 MG tablet Take 20 mg by mouth at bedtime.    11/24/2016 at qhs  . aspirin 81 MG tablet Take 81 mg by mouth daily.   11/24/2016 at qhs   . enalapril (VASOTEC) 10 MG tablet Take 10 mg by mouth daily.    11/25/2016 at am  . linagliptin (TRADJENTA) 5 MG TABS tablet Take 5 mg by mouth daily.    11/25/2016 at am  . nitrofurantoin (MACRODANTIN) 50 MG capsule Take 50 mg by mouth daily before breakfast.    11/25/2016 at am  . rosuvastatin (CRESTOR) 5 MG tablet Take 5 mg by mouth See admin instructions. THREE TIMES WEEKLY, (Monday, Wednesday AND Friday)   11/24/2016 at am  . tamsulosin (FLOMAX) 0.4 MG CAPS capsule Take 0.4 mg by mouth daily after breakfast.    11/25/2016 at am   Home medication reconciliation was completed with the patient.   Scheduled Inpatient Medications:   . amitriptyline  20 mg Oral QHS  . aspirin  81 mg Oral Daily  . cholestyramine light  4 g Oral BID  . finasteride  5 mg Oral Daily  . heparin  5,000 Units Subcutaneous Q8H  . insulin aspart  0-5 Units Subcutaneous QHS  . insulin aspart  0-9 Units Subcutaneous TID WC  . insulin aspart  3 Units  Subcutaneous TID WC  . pantoprazole  40 mg Oral Daily  . tamsulosin  0.8 mg Oral QPC breakfast    Continuous Inpatient Infusions:    PRN Inpatient Medications:  acetaminophen **OR** acetaminophen, loperamide, metoCLOPramide, ondansetron **OR** ondansetron (ZOFRAN) IV  Family History: family history is not on file.  The patient's family history is negative for inflammatory bowel disorders, GI malignancy, or solid organ transplantation.  Social History:   reports that he has never smoked. He has never used smokeless tobacco. He reports that he  does not drink alcohol or use drugs. The patient denies ETOH, tobacco, or drug use.   Review of Systems: Constitutional: Weight is stable.  Eyes: No changes in vision. ENT: No oral lesions, sore throat.  GI: see HPI.  Heme/Lymph: No easy bruising.  CV: No chest pain.  GU: No hematuria.  Integumentary: No rashes.  Neuro: No headaches.  Psych: No depression/anxiety.  Endocrine: No heat/cold intolerance.  Allergic/Immunologic: No urticaria.  Resp: No cough, SOB.  Musculoskeletal: No joint swelling. Serious hip disease may need a replacement, uses a walker.    Physical Examination: BP (!) 104/58 (BP Location: Right Arm)   Pulse 64   Temp 97.8 F (36.6 C) (Oral)   Resp 20   Ht 5\' 10"  (1.778 m)   Wt 63.5 kg (140 lb)   SpO2 96%   BMI 20.09 kg/m  Gen: NAD, alert and oriented x 4 HEENT: PEERLA, EOMI, Neck: supple, no JVD or thyromegaly Chest: CTA bilaterally, no wheezes, crackles, or other adventitious sounds CV: RRR, no m/g/c/r Abd: soft, NT, ND, +BS in all four quadrants; no HSM, guarding, ridigity, or rebound tenderness Ext: no edema, well perfused with 2+ pulses, Skin: no rash or lesions noted Lymph: no LAD  Data: Lab Results  Component Value Date   WBC 2.9 (L) 11/27/2016   HGB 11.6 (L) 11/27/2016   HCT 34.1 (L) 11/27/2016   MCV 96.3 11/27/2016   PLT 111 (L) 11/27/2016    Recent Labs Lab 11/25/16 1720 11/26/16 0705 11/27/16 0633  HGB 14.3 13.4 11.6*   Lab Results  Component Value Date   NA 138 11/27/2016   K 4.2 11/27/2016   CL 114 (H) 11/27/2016   CO2 21 (L) 11/27/2016   BUN 27 (H) 11/27/2016   CREATININE 1.86 (H) 11/27/2016   Lab Results  Component Value Date   ALT 9 (L) 11/27/2016   AST 10 (L) 11/27/2016   ALKPHOS 33 (L) 11/27/2016   BILITOT 0.6 11/27/2016   No results for input(s): APTT, INR, PTT in the last 168 hours. Assessment/Plan: [name] is a 83 y.o. male who appears to have had possible  food poisoning from uncertain source but is now  much improved with no diarrhea today.  My exam shows non tender abdomen, no HSM.  No vomiting either. He may be candidate for discharge if he does well over night.  Discussed with Dr. Ether Griffins.  Recommendations:  Thank you for the consult. Please call with questions or concerns.  Gaylyn Cheers, MD

## 2016-11-27 NOTE — Care Management Note (Signed)
Case Management Note  Patient Details  Name: Derek Guerra MRN: [id number] Date of Birth: [birth year]  Subjective/Objective:   Spoke with patient regarding discharge planning. He lives alone at [facility] independent Living. He lives alone. His wife died approximately 2 weeks ago. Offered choice of home health agencies and he has no preference. [facility]. He has a walker and rollator. It is anticipated patient will discharge tomorrow. PCP is D. r. Kystra.               Action/Plan:   Expected Discharge Date:                  Expected Discharge Plan:  [place]  In-House Referral:     Discharge planning Services  CM Consult  Post Acute Care Choice:  Home Health Choice offered to:  Patient  DME Arranged:    DME Agency:     HH Arranged:  PT, Nursing [place] Agency:  [place]  Status of Service:  In process, will continue to follow  If discussed at Long Length of Stay Meetings, dates discussed:    Additional Comments:  Jolly Mango, RN 11/27/2016, 4:16 PM

## 2016-11-27 NOTE — Progress Notes (Signed)
Dr. Ether Griffins notified of bladder scanner results. Acknowledged; new orders written. Barbaraann Faster, RN 7:12 AM 11/27/2016

## 2016-11-27 NOTE — Plan of Care (Signed)
Problem: Bowel/Gastric: Goal: Will not experience complications related to bowel motility Outcome: Progressing [place] BMs lessening;

## 2016-11-27 NOTE — Evaluation (Signed)
Physical Therapy Evaluation Patient Details Name: Derek Guerra MRN: [id number] DOB: Apr 13, 1932 Today's Date: 11/27/2016   History of Present Illness  Pt is a 83 y/o M who presented with diarrhea for 4 days and was admitted for acute enteritis. C diff testing was negative. Pt's PMH includes prostate cancer, CKD.    Clinical Impression  Pt admitted with above diagnosis. Pt currently with functional limitations due to the deficits listed below (see PT Problem List). Derek Guerra was Ind with ADLs and IADLs and ambulating with rollator PTA.  He currently requires supervision for safety with transfers and ambulation.  Pt scored a 40/56 on the [employer], indicating pt is at an increased risk of falling.  Results explained to the pt. Pt will benefit from skilled PT to increase their independence and safety with mobility to allow discharge to the venue listed below.      Follow Up Recommendations Home health PT    Equipment Recommendations  None recommended by PT    Recommendations for Other Services       Precautions / Restrictions Precautions Precautions: Fall Restrictions Weight Bearing Restrictions: No      Mobility  Bed Mobility Overal bed mobility: Independent             General bed mobility comments: No physical assist or cues needed.  Pt performs independently.  Transfers Overall transfer level: Needs assistance Equipment used: Rolling walker (2 wheeled) Transfers: Sit to/from Stand Sit to Stand: Supervision         General transfer comment: Supervision for safety and cues for hand placement and proper use of RW as pt used to using Rollator.    Ambulation/Gait Ambulation/Gait assistance: Supervision Ambulation Distance (Feet): 350 Feet Assistive device: Rolling walker (2 wheeled) Gait Pattern/deviations: Step-through pattern;Trunk flexed;Decreased weight shift to left;Decreased stance time - left   Gait velocity interpretation: at or above normal speed  for age/gender General Gait Details: Flexed posture which improves some with verbal cues.  Dec stance time on LLE due to painful L arthritic hip with SLS.    Stairs            Wheelchair Mobility    Modified Rankin (Stroke Patients Only)       Balance Overall balance assessment: Needs assistance Sitting-balance support: No upper extremity supported;Feet supported Sitting balance-Leahy Scale: Normal     Standing balance support: No upper extremity supported;During functional activity Standing balance-Leahy Scale: Fair Standing balance comment: Pt able to stand statically without UE support but relies on RW for dynamic activities                 Standardized Balance Assessment Standardized Balance Assessment : Berg Balance Test Berg Balance Test Sit to Stand: Able to stand without using hands and stabilize independently Standing Unsupported: Able to stand safely 2 minutes Sitting with Back Unsupported but Feet Supported on Floor or Stool: Able to sit safely and securely 2 minutes Stand to Sit: Sits safely with minimal use of hands Transfers: Able to transfer safely, minor use of hands Standing Unsupported with Eyes Closed: Able to stand 10 seconds with supervision Standing Ubsupported with Feet Together: Able to place feet together independently and stand for 1 minute with supervision From Standing, Reach Forward with Outstretched Arm: Can reach forward >12 cm safely (5") From Standing Position, Pick up Object from Floor: Able to pick up shoe, needs supervision From Standing Position, Turn to Look Behind Over each Shoulder: Turn sideways only but maintains balance Turn 360  Degrees: Able to turn 360 degrees safely but slowly Standing Unsupported, Alternately Place Feet on Step/Stool: Able to stand independently and safely and complete 8 steps in 20 seconds Standing Unsupported, One Foot in Front: Loses balance while stepping or standing Standing on One Leg: Unable to  try or needs assist to prevent fall Total Score: 40         Pertinent Vitals/Pain Pain Assessment: 0-10 Pain Score: 9  Pain Location: L hip with L SLS (h/o OA and hoping for THA) Pain Descriptors / Indicators: Grimacing;Aching Pain Intervention(s): Limited activity within patient's tolerance;Monitored during session    Home Living Family/patient expects to be discharged to:: Private residence Living Arrangements: Alone Available Help at Discharge:  (pt denies having any family or friends in the area) Type of Home: Independent living facility Home Access: Level entry     Home Layout: One level Home Equipment: Walker - 4 wheels;Grab bars - toilet;Grab bars - tub/shower (Rollator)      Prior Function Level of Independence: Independent with assistive device(s)         Comments: Uses Rollator to ambulate.  Denies any falls over the past 6 months.  Is independent with ADLs and IADLs     Hand Dominance   Dominant Hand: Right    Extremity/Trunk Assessment   Upper Extremity Assessment Upper Extremity Assessment: Overall WFL for tasks assessed    Lower Extremity Assessment Lower Extremity Assessment: LLE deficits/detail (RLE strength grossly 5/5) LLE Deficits / Details: LLE strength grossly 4/5    Cervical / Trunk Assessment Cervical / Trunk Assessment: Kyphotic  Communication   Communication: No difficulties  Cognition Arousal/Alertness: Awake/alert Behavior During Therapy: WFL for tasks assessed/performed Overall Cognitive Status: Within Functional Limits for tasks assessed                      General Comments General comments (skin integrity, edema, etc.): Pt scored a 40/56 on the [employer], indicating pt is at an increased risk of falling.  Pt has a NuStep which he uses 15 minutes in the morning and 15 minutes in the evening.  He also mentions having an exercise DVD.    Exercises     Assessment/Plan    PT Assessment Patient needs continued  PT services  PT Problem List Decreased strength;Decreased balance;Decreased safety awareness;Pain       PT Treatment Interventions DME instruction;Gait training;Functional mobility training;Therapeutic activities;Therapeutic exercise;Neuromuscular re-education;Balance training;Patient/family education;Modalities    PT Goals (Current goals can be found in the Care Plan section)  Acute Rehab PT Goals Patient Stated Goal: to have his L hip replaced.  To improve his balance PT Goal Formulation: With patient Time For Goal Achievement: 12/11/16 Potential to Achieve Goals: Good    Frequency Min 2X/week   Barriers to discharge        Co-evaluation               End of Session Equipment Utilized During Treatment: Gait belt Activity Tolerance: Patient tolerated treatment well Patient left: in bed;with call bell/phone within reach;with bed alarm set Nurse Communication: Mobility status PT Visit Diagnosis: Unsteadiness on feet (R26.81);Difficulty in walking, not elsewhere classified (R26.2)    Functional Assessment Tool Used: Clinical judgement;AM-PAC 6 Clicks Basic Mobility;Berg Functional Limitation: Mobility: Walking and moving around Mobility: Walking and Moving Around Current Status [phone]): At least 20 percent but less than 40 percent impaired, limited or restricted Mobility: Walking and Moving Around Goal Status [phone]): At least 1 percent but less  than 20 percent impaired, limited or restricted    Time: 1330-1355 PT Time Calculation (min) (ACUTE ONLY): 25 min   Charges:   PT Evaluation $PT Eval Low Complexity: 1 Procedure PT Treatments $Gait Training: 8-22 mins   PT G Codes:   PT G-Codes **NOT FOR INPATIENT CLASS** Functional Assessment Tool Used: Clinical judgement;AM-PAC 6 Clicks Basic Mobility;Berg Functional Limitation: Mobility: Walking and moving around Mobility: Walking and Moving Around Current Status [phone]): At least 20 percent but less than 40 percent  impaired, limited or restricted Mobility: Walking and Moving Around Goal Status [phone]): At least 1 percent but less than 20 percent impaired, limited or restricted     Collie Siad PT, DPT 11/27/2016, 2:14 PM

## 2016-11-27 NOTE — Progress Notes (Signed)
Pt CBG 63, asymptomatic. Given orange juice. CBG 89 on recheck. Given peanut butter and crackers.

## 2016-11-28 DIAGNOSIS — D72819 Decreased white blood cell count, unspecified: Secondary | ICD-10-CM

## 2016-11-28 DIAGNOSIS — K529 Noninfective gastroenteritis and colitis, unspecified: Secondary | ICD-10-CM | POA: Diagnosis not present

## 2016-11-28 DIAGNOSIS — D696 Thrombocytopenia, unspecified: Secondary | ICD-10-CM

## 2016-11-28 DIAGNOSIS — E860 Dehydration: Secondary | ICD-10-CM

## 2016-11-28 DIAGNOSIS — N189 Chronic kidney disease, unspecified: Secondary | ICD-10-CM

## 2016-11-28 DIAGNOSIS — E1110 Type 2 diabetes mellitus with ketoacidosis without coma: Secondary | ICD-10-CM

## 2016-11-28 DIAGNOSIS — K567 Ileus, unspecified: Secondary | ICD-10-CM

## 2016-11-28 DIAGNOSIS — N179 Acute kidney failure, unspecified: Secondary | ICD-10-CM

## 2016-11-28 DIAGNOSIS — R338 Other retention of urine: Secondary | ICD-10-CM

## 2016-11-28 DIAGNOSIS — R531 Weakness: Secondary | ICD-10-CM

## 2016-11-28 LAB — CBC
HEMATOCRIT: 35.4 % — AB (ref 40.0–52.0)
HEMOGLOBIN: 12.1 g/dL — AB (ref 13.0–18.0)
MCH: 32.7 pg (ref 26.0–34.0)
MCHC: 34.2 g/dL (ref 32.0–36.0)
MCV: 95.6 fL (ref 80.0–100.0)
Platelets: 136 10*3/uL — ABNORMAL LOW (ref 150–440)
RBC: 3.7 MIL/uL — AB (ref 4.40–5.90)
RDW: 13.2 % (ref 11.5–14.5)
WBC: 4.3 10*3/uL (ref 3.8–10.6)

## 2016-11-28 LAB — CREATININE, SERUM
Creatinine, Ser: 1.92 mg/dL — ABNORMAL HIGH (ref 0.61–1.24)
GFR calc non Af Amer: 31 mL/min — ABNORMAL LOW (ref >60)
GFR, EST AFRICAN AMERICAN: 36 mL/min — AB (ref >60)

## 2016-11-28 LAB — GLUCOSE, CAPILLARY
GLUCOSE-CAPILLARY: 103 mg/dL — AB (ref 65–99)
Glucose-Capillary: 124 mg/dL — ABNORMAL HIGH (ref 65–99)

## 2016-11-28 MED ORDER — METOCLOPRAMIDE HCL 5 MG PO TABS: 5.0000 mg | ORAL_TABLET | Freq: Four times a day (QID) | ORAL | 0 refills | Status: DC | PRN

## 2016-11-28 MED ORDER — PANTOPRAZOLE SODIUM 40 MG PO TBEC: 40.0000 mg | DELAYED_RELEASE_TABLET | Freq: Every day | ORAL | 3 refills | Status: DC

## 2016-11-28 MED ORDER — FINASTERIDE 5 MG PO TABS: 5.0000 mg | ORAL_TABLET | Freq: Every day | ORAL | 0 refills | Status: DC

## 2016-11-28 MED ORDER — LOPERAMIDE HCL 2 MG PO CAPS: 2.0000 mg | ORAL_CAPSULE | ORAL | 0 refills | Status: DC | PRN

## 2016-11-28 NOTE — Progress Notes (Signed)
Inpatient Diabetes Program Recommendations  AACE/ADA: New Consensus Statement on Inpatient Glycemic Control (2015)  Target Ranges:  Prepandial:   less than 140 mg/dL      Peak postprandial:   less than 180 mg/dL (1-2 hours)      Critically ill patients:  140 - 180 mg/dL   Results for Derek Guerra, Derek Guerra (MRN [id number]) as of 11/28/2016 09:23  Ref. Range 11/27/2016 07:45 11/27/2016 12:04 11/27/2016 17:09 11/27/2016 21:33 11/27/2016 22:00 11/28/2016 07:50  Glucose-Capillary Latest Ref Range: 65 - 99 mg/dL 120 (H) 128 (H) 142 (H) 63 (L) 89 124 (H)   Review of Glycemic Control  Diabetes history: DM2 Outpatient Diabetes medications: Tradjenta 5 mg daily Current orders for Inpatient glycemic control: Novolog 0-9 units TID with meals, Novolog 0-5 units QHS, Novolog 3 units TID with meals for meal coverage  Inpatient Diabetes Program Recommendations:  Insulin - Meal Coverage: Noted CBG down to 63 mg/dl at 21:33 on 11/27/16. May need to decrease or discontinue meal coverage if patient experiences any other episodes of hypoglycemia while inpatient.  Thanks, Barnie Alderman, RN, MSN, CDE Diabetes Coordinator Inpatient Diabetes Program [phone] (Team Pager from 8am to 5pm)

## 2016-11-28 NOTE — Discharge Summary (Signed)
[facility] [place] NAME: Derek Guerra    MR#:  [id number]  DATE OF BIRTH:  Aug 30, 1932  DATE OF ADMISSION:  11/25/2016 ADMITTING PHYSICIAN: Saundra Shelling, MD  DATE OF DISCHARGE: 11/28/2016  4:01 PM  PRIMARY CARE PHYSICIAN: Glean Salvo, MD     ADMISSION DIAGNOSIS:  weakness  DISCHARGE DIAGNOSIS:  Principal Problem:   Enteritis Active Problems:   Dehydration   Ileus ([place])   Acute on chronic renal failure (HCC)   Acute urinary retention   DKA (diabetic ketoacidoses) (HCC)   Leukopenia   Thrombocytopenia (HCC)   Generalized weakness   SECONDARY DIAGNOSIS:   Past Medical History:  Diagnosis Date  . Chronic kidney disease   . Diabetes mellitus without complication ([place])   . Prostate cancer ([place])     .pro HOSPITAL COURSE:   The patient is a 83 year old Caucasian male with past medical history significant for history of CAD, diabetes mellitus, prostate cancer, who currently presents to the hospital with complaints of watery diarrhea for the past 4 days, dry heaving, nausea, mild abdominal pain. Apparently 8, smokes, 4 days ago and became sick. He was also found to be weak. Stool cultures for enteric pathogens, C. difficile were negative. Hospitalist services were contacted for admission. Patient was initiated on cholestyramine, PPIs, and improved.  His appetite has improved and he is able to eat Soft diet. Patient was felt to be stable to discharge home. Discussion by problem: #1. Acute enteritis of likely viral etiology, cannot rule out toxic, Improved clinically,  continue supportive therapy, PPI, Reglan, Loperamide as needed   #2. ileus, continue Reglan as needed, clinically resolved #3, Acute on chronic renal failure, baseline GFR of 30,  improved on IV fluids, at  baseline today  #4.  mild DKA, . The patient was managed on sliding  scale insulin, DKA resolved  . Blood glucose ranges between 80-130 . Hemoglobin A1c  6.9, good control #5. Leukopenia, , resolved #6 thrombocytopenia, improved  #7. Generalized weakness, physical therapist saw patient in consultation and recommended home health services, will be prescribed for him today on discharge home  DISCHARGE CONDITIONS:   Stable  CONSULTS OBTAINED:  Treatment Team:  Manya Silvas, MD  DRUG ALLERGIES:   Allergies  Allergen Reactions  . Gemfibrozil Other (See Comments)    Other reaction(s): MUSCLE PAIN Other reaction(s): MUSCLE PAIN  . Niacin Other (See Comments)    Other reaction(s): MUSCLE PAIN Other reaction(s): MUSCLE PAIN  . Nifedipine Other (See Comments)    Other reaction(s): MUSCLE PAIN Other reaction(s): MUSCLE PAIN  . Simvastatin Other (See Comments)    Other reaction(s): MUSCLE PAIN Other reaction(s): MUSCLE PAIN    DISCHARGE MEDICATIONS:   Discharge Medication List as of 11/28/2016  1:46 PM    START taking these medications   Details  finasteride (PROSCAR) 5 MG tablet Take 1 tablet (5 mg total) by mouth daily., Starting Sat 11/29/2016, Normal    loperamide (IMODIUM) 2 MG capsule Take 1 capsule (2 mg total) by mouth as needed for diarrhea or loose stools., Starting Fri 11/28/2016, Normal    metoCLOPramide (REGLAN) 5 MG tablet Take 1 tablet (5 mg total) by mouth every 6 (six) hours as needed for nausea., Starting Fri 11/28/2016, Normal    pantoprazole (PROTONIX) 40 MG tablet Take 1 tablet (40 mg total) by mouth daily., Starting Sat 11/29/2016, Normal      CONTINUE these medications which have NOT CHANGED   Details  amitriptyline (ELAVIL) 10 MG tablet Take 20 mg by mouth at bedtime. , Starting Mon 04/28/2016, Historical Med    aspirin 81 MG tablet Take 81 mg by mouth daily., Historical Med    linagliptin (TRADJENTA) 5 MG TABS tablet Take 5 mg by mouth daily. , Historical Med    rosuvastatin (CRESTOR) 5 MG tablet Take 5 mg by mouth See admin instructions. THREE TIMES WEEKLY, (Monday, Wednesday AND Friday), Starting Fri  08/08/2016, Historical Med    tamsulosin (FLOMAX) 0.4 MG CAPS capsule Take 0.4 mg by mouth daily after breakfast. , Starting Tue 06/12/2015, Historical Med      STOP taking these medications     enalapril (VASOTEC) 10 MG tablet      nitrofurantoin (MACRODANTIN) 50 MG capsule          DISCHARGE INSTRUCTIONS:   The patient is to follow-up with primary care physician within one week after discharge  If you experience worsening of your admission symptoms, develop shortness of breath, life threatening emergency, suicidal or homicidal thoughts you must seek medical attention immediately by calling 911 or calling your MD immediately  if symptoms less severe.  You Must read complete instructions/literature along with all the possible adverse reactions/side effects for all the Medicines you take and that have been prescribed to you. Take any new Medicines after you have completely understood and accept all the possible adverse reactions/side effects.   Please note  You were cared for by a hospitalist during your hospital stay. If you have any questions about your discharge medications or the care you received while you were in the hospital after you are discharged, you can call the unit and asked to speak with the hospitalist on call if the hospitalist that took care of you is not available. Once you are discharged, your primary care physician will handle any further medical issues. Please note that NO REFILLS for any discharge medications will be authorized once you are discharged, as it is imperative that you return to your primary care physician (or establish a relationship with a primary care physician if you do not have one) for your aftercare needs so that they can reassess your need for medications and monitor your lab values.    Today   CHIEF COMPLAINT:   Chief Complaint  Patient presents with  . Weakness  . Emesis  . Diarrhea    HISTORY OF PRESENT ILLNESS:  Derek Guerra  is a 83  y.o. male with a known history of CAD, diabetes mellitus, prostate cancer, who currently presents to the hospital with complaints of watery diarrhea for the past 4 days, dry heaving, nausea, mild abdominal pain. Apparently 8, smokes, 4 days ago and became sick. He was also found to be weak. Stool cultures for enteric pathogens, C. difficile were negative. Hospitalist services were contacted for admission. Patient was initiated on cholestyramine, PPIs, and improved.  His appetite has improved and he is able to eat Soft diet. Patient was felt to be stable to discharge home. Discussion by problem: #1. Acute enteritis of likely viral etiology, cannot rule out toxic, Improved clinically,  continue supportive therapy, PPI, Reglan, Loperamide as needed   #2. ileus, continue Reglan as needed, clinically resolved #3, Acute on chronic renal failure, baseline GFR of 30,  improved on IV fluids, at  baseline today  #4.  mild DKA, . The patient was managed on sliding  scale insulin, DKA resolved  . Blood glucose ranges between 80-130 . Hemoglobin A1c 6.9, good control #  5. Leukopenia, , resolved #6 thrombocytopenia, improved  #7. Generalized weakness, physical therapist saw patient in consultation and recommended home health services, will be prescribed for him today on discharge home     VITAL SIGNS:  Blood pressure 129/77, pulse 65, temperature 97.5 F (36.4 C), temperature source Oral, resp. rate 18, height 5\' 10"  (1.778 m), weight 63.5 kg (140 lb), SpO2 100 %.  I/O:   Intake/Output Summary (Last 24 hours) at 11/28/16 1634 Last data filed at 11/28/16 1009  Gross per 24 hour  Intake             1676 ml  Output             1925 ml  Net             -249 ml    PHYSICAL EXAMINATION:  GENERAL:  83 y.o.-year-old patient lying in the bed with no acute distress.  EYES: Pupils equal, round, reactive to light and accommodation. No scleral icterus. Extraocular muscles intact.  HEENT: Head atraumatic,  normocephalic. Oropharynx and nasopharynx clear.  NECK:  Supple, no jugular venous distention. No thyroid enlargement, no tenderness.  LUNGS: Normal breath sounds bilaterally, no wheezing, rales,rhonchi or crepitation. No use of accessory muscles of respiration.  CARDIOVASCULAR: S1, S2 normal. No murmurs, rubs, or gallops.  ABDOMEN: Soft, non-tender, non-distended. Bowel sounds present. No organomegaly or mass.  EXTREMITIES: No pedal edema, cyanosis, or clubbing.  NEUROLOGIC: Cranial nerves II through XII are intact. Muscle strength 5/5 in all extremities. Sensation intact. Gait not checked.  PSYCHIATRIC: The patient is alert and oriented x 3.  SKIN: No obvious rash, lesion, or ulcer.   DATA REVIEW:   CBC  Recent Labs Lab 11/28/16 0410  WBC 4.3  HGB 12.1*  HCT 35.4*  PLT 136*    Chemistries   Recent Labs Lab 11/25/16 1720  11/27/16 0633 11/28/16 0410  NA 134*  < > 138  --   K 4.4  < > 4.2  --   CL 102  < > 114*  --   CO2 23  < > 21*  --   GLUCOSE 238*  < > 122*  --   BUN 38*  < > 27*  --   CREATININE 2.32*  < > 1.86* 1.92*  CALCIUM 8.9  < > 8.2*  --   MG 1.9  --   --   --   AST  --   --  10*  --   ALT  --   --  9*  --   ALKPHOS  --   --  33*  --   BILITOT  --   --  0.6  --   < > = values in this interval not displayed.  Cardiac Enzymes No results for input(s): TROPONINI in the last 168 hours.  Microbiology Results  Results for orders placed or performed during the hospital encounter of 11/25/16  C difficile quick scan w PCR reflex     Status: None   Collection Time: 11/25/16 10:06 PM  Result Value Ref Range Status   C Diff antigen NEGATIVE NEGATIVE Final   C Diff toxin NEGATIVE NEGATIVE Final   C Diff interpretation No C. difficile detected.  Final  Gastrointestinal Panel by PCR , Stool     Status: None   Collection Time: 11/25/16 10:06 PM  Result Value Ref Range Status   Campylobacter species NOT DETECTED NOT DETECTED Final   Plesimonas shigelloides NOT  DETECTED NOT DETECTED Final  Salmonella species NOT DETECTED NOT DETECTED Final   Yersinia enterocolitica NOT DETECTED NOT DETECTED Final   Vibrio species NOT DETECTED NOT DETECTED Final   Vibrio cholerae NOT DETECTED NOT DETECTED Final   Enteroaggregative E coli (EAEC) NOT DETECTED NOT DETECTED Final   Enteropathogenic E coli (EPEC) NOT DETECTED NOT DETECTED Final   Enterotoxigenic E coli (ETEC) NOT DETECTED NOT DETECTED Final   Shiga like toxin producing E coli (STEC) NOT DETECTED NOT DETECTED Final   Shigella/Enteroinvasive E coli (EIEC) NOT DETECTED NOT DETECTED Final   Cryptosporidium NOT DETECTED NOT DETECTED Final   Cyclospora cayetanensis NOT DETECTED NOT DETECTED Final   Entamoeba histolytica NOT DETECTED NOT DETECTED Final   Giardia lamblia NOT DETECTED NOT DETECTED Final   Adenovirus F40/41 NOT DETECTED NOT DETECTED Final   Astrovirus NOT DETECTED NOT DETECTED Final   Norovirus GI/GII NOT DETECTED NOT DETECTED Final   Rotavirus A NOT DETECTED NOT DETECTED Final   Sapovirus (I, II, IV, and V) NOT DETECTED NOT DETECTED Final    RADIOLOGY:  No results found.  EKG:   Orders placed or performed during the hospital encounter of 11/25/16  . ED EKG  . ED EKG  . EKG 12-Lead  . EKG 12-Lead      Management plans discussed with the patient, family and they are in agreement.  CODE STATUS:     Code Status Orders        Start     Ordered   11/26/16 0348  Full code  Continuous     11/26/16 0348    Code Status History    Date Active Date Inactive Code Status Order ID Comments User Context   This patient has a current code status but no historical code status.    Advance Directive Documentation     Most Recent Value  Type of Advance Directive  Living will  Pre-existing out of facility DNR order (yellow form or pink MOST form)  -  "MOST" Form in Place?  -      TOTAL TIME TAKING CARE OF THIS PATIENT: . 40 minutes.    Theodoro Grist M.D on 11/28/2016 at 4:34  PM  Between 7am to 6pm - Pager - [phone]  After 6pm go to [email or web address] - password [email or web address] [facility]  Office  [phone]  CC: Primary care physician; Glean Salvo, MD

## 2016-11-28 NOTE — Care Management Note (Signed)
Case Management Note  Patient Details  Name: Kamaree Wheatley MRN: [id number] Date of Birth: [birth year]  Subjective/Objective:   Discharging today                 Action/Plan: [facility] notified of discharge. Primary nurse updated.   Expected Discharge Date:  11/28/16               Expected Discharge Plan:  [place]  In-House Referral:     Discharge planning Services  CM Consult  Post Acute Care Choice:  Home Health Choice offered to:  Patient  DME Arranged:    DME Agency:     HH Arranged:  PT [place]:  [place]  Status of Service:  Completed, signed off  If discussed at [place] of Stay Meetings, dates discussed:    Additional Comments:  Jolly Mango, RN 11/28/2016, 3:50 PM

## 2016-11-28 NOTE — Progress Notes (Signed)
Patient picked up by [facility] transportation, patient is alert and oriented, no acute distress noted.

## 2016-11-28 NOTE — Progress Notes (Signed)
Patient discharged to home, discharge instructions given as ordered. Patient is alert and oriented ambulates with a walker. [facility] called and patient will be picked up at 1530.

## 2016-12-04 NOTE — Progress Notes (Signed)
[facility]  Patient Status: not taken under care, patient refused all [place] services. Notified Orvan July, CM and Marshell Garfinkel, CM.   Marilynne Drivers [place] 12/04/2016, 2:26 PM

## 2016-12-31 ENCOUNTER — Encounter
Admission: RE | Admit: 2016-12-31 | Discharge: 2016-12-31 | Disposition: A | Discharge status: Home / Self Care | Payer: Medicare Other | Source: Ambulatory Visit | Attending: Orthopedic Surgery | Admitting: Orthopedic Surgery

## 2016-12-31 DIAGNOSIS — Z01818 Encounter for other preprocedural examination: Secondary | ICD-10-CM | POA: Diagnosis not present

## 2016-12-31 HISTORY — DX: Unspecified urinary incontinence: R32

## 2016-12-31 LAB — CBC WITH DIFFERENTIAL/PLATELET
Basophils Absolute: 0 10*3/uL (ref 0–0.1)
Basophils Relative: 1 %
Eosinophils Absolute: 0.1 10*3/uL (ref 0–0.7)
Eosinophils Relative: 2 %
HEMATOCRIT: 44.1 % (ref 40.0–52.0)
HEMOGLOBIN: 15.1 g/dL (ref 13.0–18.0)
LYMPHS ABS: 1.1 10*3/uL (ref 1.0–3.6)
Lymphocytes Relative: 18 %
MCH: 32.9 pg (ref 26.0–34.0)
MCHC: 34.3 g/dL (ref 32.0–36.0)
MCV: 95.8 fL (ref 80.0–100.0)
MONO ABS: 0.3 10*3/uL (ref 0.2–1.0)
Monocytes Relative: 6 %
NEUTROS ABS: 4.3 10*3/uL (ref 1.4–6.5)
NEUTROS PCT: 73 %
Platelets: 132 10*3/uL — ABNORMAL LOW (ref 150–440)
RBC: 4.61 MIL/uL (ref 4.40–5.90)
RDW: 13.3 % (ref 11.5–14.5)
WBC: 5.8 10*3/uL (ref 3.8–10.6)

## 2016-12-31 LAB — URINALYSIS, ROUTINE W REFLEX MICROSCOPIC
Bacteria, UA: NONE SEEN
Bilirubin Urine: NEGATIVE
HGB URINE DIPSTICK: NEGATIVE
KETONES UR: NEGATIVE mg/dL
Leukocytes, UA: NEGATIVE
NITRITE: NEGATIVE
PROTEIN: 30 mg/dL — AB
Specific Gravity, Urine: 1.016 (ref 1.005–1.030)
pH: 5 (ref 5.0–8.0)

## 2016-12-31 LAB — COMPREHENSIVE METABOLIC PANEL
ALK PHOS: 84 U/L (ref 38–126)
ALT: 19 U/L (ref 17–63)
ANION GAP: 9 (ref 5–15)
AST: 20 U/L (ref 15–41)
Albumin: 4.6 g/dL (ref 3.5–5.0)
BILIRUBIN TOTAL: 0.9 mg/dL (ref 0.3–1.2)
BUN: 28 mg/dL — AB (ref 6–20)
CO2: 28 mmol/L (ref 22–32)
Calcium: 9.7 mg/dL (ref 8.9–10.3)
Chloride: 101 mmol/L (ref 101–111)
Creatinine, Ser: 1.54 mg/dL — ABNORMAL HIGH (ref 0.61–1.24)
GFR calc Af Amer: 47 mL/min — ABNORMAL LOW (ref >60)
GFR, EST NON AFRICAN AMERICAN: 40 mL/min — AB (ref >60)
GLUCOSE: 207 mg/dL — AB (ref 65–99)
POTASSIUM: 3.7 mmol/L (ref 3.5–5.1)
Sodium: 138 mmol/L (ref 135–145)
TOTAL PROTEIN: 7.3 g/dL (ref 6.5–8.1)

## 2016-12-31 LAB — PROTIME-INR
INR: 1
PROTHROMBIN TIME: 13.2 s (ref 11.4–15.2)

## 2016-12-31 LAB — APTT: aPTT: 35 seconds (ref 24–36)

## 2016-12-31 LAB — SEDIMENTATION RATE: Sed Rate: 2 mm/hr (ref 0–20)

## 2016-12-31 LAB — TYPE AND SCREEN
ABO/RH(D): A POS
ANTIBODY SCREEN: NEGATIVE

## 2016-12-31 LAB — C-REACTIVE PROTEIN

## 2016-12-31 LAB — SURGICAL PCR SCREEN
MRSA, PCR: NEGATIVE
Staphylococcus aureus: NEGATIVE

## 2016-12-31 NOTE — Pre-Procedure Instructions (Signed)
EKG from ER visit on 11/25/16 reviewed with Ginger Carne RN along with ED doctor's notes on the EKG.  OK to proceed per [facility] Therapist, sports.

## 2016-12-31 NOTE — Patient Instructions (Signed)
  Your procedure is scheduled on:Wednesday Jan 14, 2017. Report to Same Day Surgery. To find out your arrival time please call [phone] between 1PM - 3PM on Tuesday Jan 13, 2017.  Remember: Instructions that are not followed completely may result in serious medical risk, up to and including death, or upon the discretion of your surgeon and anesthesiologist your surgery may need to be rescheduled.    _x___ 1. Do not eat food or drink liquids after midnight. No gum chewing or hard candies.     ____ 2. No Alcohol for 24 hours before or after surgery.   ____ 3. Bring all medications with you on the day of surgery if instructed.    __x__ 4. Notify your doctor if there is any change in your medical condition     (cold, fever, infections).    _____ 5. No smoking 24 hours prior to surgery.     Do not wear jewelry, make-up, hairpins, clips or nail polish.  Do not wear lotions, powders, or perfumes.   Do not shave 48 hours prior to surgery. Men may shave face and neck.  Do not bring valuables to the hospital.    [facility] is not responsible for any belongings or valuables.               Contacts, dentures or bridgework may not be worn into surgery.  Leave your suitcase in the car. After surgery it may be brought to your room.  For patients admitted to the hospital, discharge time is determined by your treatment team.   Patients discharged the day of surgery will not be allowed to drive home.    Please read over the following fact sheets that you were given:   [facility] Preparing for Surgery  _x___ Take these medicines the morning of surgery with A SIP OF WATER:    1. pantoprazole (PROTONIX)     ____ Fleet Enema (as directed)   _x___ Use CHG Soap as directed on instruction sheet  ____ Use inhalers on the day of surgery and bring to hospital day of surgery  ____ Stop metformin 2 days prior to surgery    ____ Take 1/2 of usual insulin dose the night before surgery and none on  the morning of surgery.   __x__ Stop aspirin on Jan 07, 2017.  _x___ Stop Anti-inflammatories such as Advil, Aleve, Ibuprofen, Motrin, Naproxen, Naprosyn, Goodies powders or aspirin products. OK to take Tylenol.   ____ Stop supplements until after surgery.    ____ Bring C-Pap to the hospital.

## 2016-12-31 NOTE — Pre-Procedure Instructions (Signed)
Met Ba and CBC faxed to Dr. Clydell Hakim for review.

## 2016-12-31 NOTE — Pre-Procedure Instructions (Signed)
Progress Notes - in this encounter  Derek Dodge, NP - 08/25/2016 11:30 AM EST Formatting of this note may be different from the original. Established Patient Visit   Chief Complaint: Chief Complaint  Patient presents with  . Follow-up  6 month  . Hypertension  . Hyperlipidemia  . Peripheral Vascular Disease  . Diabetes Mellitus  Date of Service: 08/25/2016 Date of Birth: Mar 02, 1932 PCP: Ned Card, MD  History of Present Illness: Derek Guerra is a 83 y.o.male patient who comes in for follow-up, using a walker to assist with walking, and he states that his activity is most limited by left knee pain. He does continue to use his Nustep stationary exercise machine at home, and he denies any episodes of chest pain, dyspnea or other cardiac symptoms at this time. His blood pressure remains adequately controlled with the use of ACE inhibitor. He continues with low-dose Crestor for dyslipidemia, with most recent fasting lipid profile showing total cholesterol 165, HDL 57, and LDL 91, with liver enzymes within normal limits. Diabetes mellitus has been stable with recent hemoglobin A1c of 6.6%. His chronic kidney disease has been stable, with slight improvement in creatinine of 1.8. Continues with daily low-dose aspirin for further risk reduction, with no evidence of bleeding disorders. Echocardiogram performed in 2014 has shown mild LV dysfunction with EF 45% and mild mitral and tricuspid regurgitation.  Past Medical and Surgical History  Past Medical History Past Medical History:  Diagnosis Date  . Diabetes mellitus type 2, uncomplicated (CMS-HCC)  . Hyperlipidemia, unspecified  . Hypertension  . Peripheral vascular disease (CMS-HCC)   Past Surgical History He has no past surgical history on file.   Medications and Allergies  Current Medications  Current Outpatient Prescriptions  Medication Sig Dispense Refill  . amitriptyline (ELAVIL) 10 MG tablet Take 20 mg by mouth  nightly.  Marland Kitchen aspirin 81 MG EC tablet Take 81 mg by mouth once daily.  . enalapril (VASOTEC) 10 MG tablet (Patient taking differently: Take 10 mg by mouth once daily. )  . linagliptin (TRADJENTA) 5 mg Tab Take 5 mg by mouth once daily.  . nitrofurantoin (MACRODANTIN) 50 MG capsule Take 50 mg by mouth.  . rosuvastatin (CRESTOR) 5 MG tablet Take 5 mg by mouth once daily.  . tamsulosin (FLOMAX) 0.4 mg capsule Take 0.4 mg by mouth.   No current facility-administered medications for this visit.   Allergies: Gemfibrozil; Niacin; Nifedipine; and Simvastatin  Social and Family History  Social History reports that he has never smoked. He has never used smokeless tobacco. He reports that he drinks alcohol. He reports that he does not use drugs.  Family History History reviewed. No pertinent family history.  Review of Systems   Review of Systems: Positive for 7 pound weight loss, no weakness or fatigue, vision change or hearing loss, cough or congestion, nausea or vomiting, diarrhea, melena or stomach pain, leg weakness or pain, leg blood clots or cramping, headache or blackouts, dizziness, trouble swallowing, skin rashes or sores, muscle weakness or numbness, anxiety or depression     Physical Examination   Vitals:BP 112/72 (BP Location: Left upper arm, Patient Position: Sitting, BP Cuff Size: Adult)  Pulse 70  Resp 16  Ht 177.8 cm (5\' 10" )  Wt 75.2 kg (165 lb 12.8 oz)  SpO2 99%  BMI 23.79 kg/m  Ht:177.8 cm (5\' 10" ) Wt:75.2 kg (165 lb 12.8 oz) YSA:YTKZ surface area is 1.93 meters squared. Body mass index is 23.79 kg/m. Appearance: well appearing in no  acute distress HEENT: Pupils equally reactive to light and accomodation without xantholasma  Neck: Supple without thyromegaly  Lungs: clear to auscultation bilaterally; no wheezes, rales, rhonchi Heart: Regular rate and rhythm. Normal S1 S2 No gallops, murmurs or rub, PMI in normal place and size. carotid upstroke normal without bruit.  Jugular venous pressure is normal Abdomen: soft nontender, nondistended, with normal bowel sounds. Abdominal aorta is normal size without bruit Extremities: no cyanosis, clubbing, or edema Peripheral Pulses: 2+ in all extremities, 2+ femoral pulses bilaterally Musculoskeletal; Normal muscle tone with moderate kyphosis Neurological:Oriented to time, place, and person with normal mood and affect   Assessment   83 y.o. male with  1. Aortic atherosclerosis (CMS-HCC)  2. Benign essential hypertension  3. Frequent PVCs  4. Peripheral vascular disease (CMS-HCC)  5. Type 2 diabetes mellitus without complication, unspecified long term insulin use status (CMS-HCC)  6. Renal insufficiency  7. History of cardiac catheterization  8. Mixed hyperlipidemia   All stable at this time.  Plan  -Continue current medical regimen for hypertension control stable at this time without side effects or symptoms  -Continue low-moderate intensive cholesterol therapy for further future risk reduction in cardiovascular disease -Continue current medical regimen of anti-platelet medication management for peripheral vascular disease and stroke risk reduction without change today which appears to be stable  -Continue current medical regimen for diabetes stable at this time on appropriate medication management with the continued goal hemoglobin A1c below 7. Further low-carbohydrate diet has been discussed today  -No further cardiac intervention of valvular heart disease on appropriate medication management and clinically stable at this time   No orders of the defined types were placed in this encounter.  Return in about 6 months (around 02/23/2017).  [place], NP

## 2016-12-31 NOTE — Pre-Procedure Instructions (Signed)
  EKG  ED ECG REPORT I, Rudene Re, the attending physician, personally viewed and interpreted this ECG.  Normal sinus rhythm, rate of 88, first-degree AV block, normal QRS and QTc intervals, left axis deviation, no ST elevations or depressions.  Clinical Course as of Nov 26 2225  Tue Nov 25, 2016  2009 Pending repeat BMP after IVF. If creatinine is improving will DC home otherwise will admit for hydration. Care transferred to Dr. Joni Fears  [CV]    Clinical Course User Index [CV] Rudene Re, MD    Pertinent labs & imaging results that were available during my care of the patient were reviewed by me and considered in my medical decision making (see chart for details).    ____________________________________________   FINAL CLINICAL IMPRESSION(S) / ED DIAGNOSES  Final diagnoses:  AKI (acute kidney injury) ([place])  Diarrhea of presumed infectious origin      [place], MD 11/26/16 2227

## 2017-01-01 LAB — HEMOGLOBIN A1C
HEMOGLOBIN A1C: 7.4 % — AB (ref 4.8–5.6)
MEAN PLASMA GLUCOSE: 166 mg/dL

## 2017-01-01 NOTE — Pre-Procedure Instructions (Signed)
Labs called and faxed to elaine at dr [facility]

## 2017-01-12 ENCOUNTER — Ambulatory Visit: Payer: Medicare Other | Admitting: Podiatry

## 2017-01-13 MED ORDER — TRANEXAMIC ACID 1000 MG/10ML IV SOLN
1000.0000 mg | INTRAVENOUS | Status: AC
  Administered 2017-01-14: 1000 mg via INTRAVENOUS
  Filled 2017-01-13: qty 10

## 2017-01-13 MED ORDER — CEFAZOLIN SODIUM-DEXTROSE 2-4 GM/100ML-% IV SOLN
2.0000 g | INTRAVENOUS | Status: AC
  Administered 2017-01-14: 2 g via INTRAVENOUS

## 2017-01-14 ENCOUNTER — Encounter
Admission: RE | Disposition: A | Discharge status: Skilled Nursing Facility | Payer: Self-pay | Source: Ambulatory Visit | Attending: Orthopedic Surgery

## 2017-01-14 ENCOUNTER — Inpatient Hospital Stay: Payer: Medicare Other | Admitting: Certified Registered Nurse Anesthetist

## 2017-01-14 ENCOUNTER — Inpatient Hospital Stay: Payer: Medicare Other

## 2017-01-14 ENCOUNTER — Encounter: Payer: Self-pay | Admitting: *Deleted

## 2017-01-14 ENCOUNTER — Inpatient Hospital Stay
Admission: RE | Admit: 2017-01-14 | Discharge: 2017-01-17 | DRG: 470 | Disposition: A | Discharge status: Skilled Nursing Facility | Payer: Medicare Other | Source: Ambulatory Visit | Attending: Orthopedic Surgery | Admitting: Orthopedic Surgery

## 2017-01-14 DIAGNOSIS — E1151 Type 2 diabetes mellitus with diabetic peripheral angiopathy without gangrene: Secondary | ICD-10-CM | POA: Diagnosis present

## 2017-01-14 DIAGNOSIS — Z8546 Personal history of malignant neoplasm of prostate: Secondary | ICD-10-CM | POA: Diagnosis not present

## 2017-01-14 DIAGNOSIS — M25552 Pain in left hip: Secondary | ICD-10-CM | POA: Diagnosis present

## 2017-01-14 DIAGNOSIS — E785 Hyperlipidemia, unspecified: Secondary | ICD-10-CM | POA: Diagnosis present

## 2017-01-14 DIAGNOSIS — M1612 Unilateral primary osteoarthritis, left hip: Secondary | ICD-10-CM | POA: Diagnosis present

## 2017-01-14 DIAGNOSIS — E1122 Type 2 diabetes mellitus with diabetic chronic kidney disease: Secondary | ICD-10-CM | POA: Diagnosis present

## 2017-01-14 DIAGNOSIS — Z888 Allergy status to other drugs, medicaments and biological substances status: Secondary | ICD-10-CM

## 2017-01-14 DIAGNOSIS — Z7984 Long term (current) use of oral hypoglycemic drugs: Secondary | ICD-10-CM | POA: Diagnosis not present

## 2017-01-14 DIAGNOSIS — I129 Hypertensive chronic kidney disease with stage 1 through stage 4 chronic kidney disease, or unspecified chronic kidney disease: Secondary | ICD-10-CM | POA: Diagnosis present

## 2017-01-14 DIAGNOSIS — Z9079 Acquired absence of other genital organ(s): Secondary | ICD-10-CM | POA: Diagnosis not present

## 2017-01-14 DIAGNOSIS — N189 Chronic kidney disease, unspecified: Secondary | ICD-10-CM | POA: Diagnosis present

## 2017-01-14 DIAGNOSIS — Z96649 Presence of unspecified artificial hip joint: Secondary | ICD-10-CM

## 2017-01-14 DIAGNOSIS — Z7982 Long term (current) use of aspirin: Secondary | ICD-10-CM

## 2017-01-14 DIAGNOSIS — Z96642 Presence of left artificial hip joint: Secondary | ICD-10-CM

## 2017-01-14 HISTORY — PX: TOTAL HIP ARTHROPLASTY: SHX124

## 2017-01-14 LAB — GLUCOSE, CAPILLARY
GLUCOSE-CAPILLARY: 162 mg/dL — AB (ref 65–99)
GLUCOSE-CAPILLARY: 216 mg/dL — AB (ref 65–99)
Glucose-Capillary: 185 mg/dL — ABNORMAL HIGH (ref 65–99)
Glucose-Capillary: 190 mg/dL — ABNORMAL HIGH (ref 65–99)
Glucose-Capillary: 240 mg/dL — ABNORMAL HIGH (ref 65–99)

## 2017-01-14 LAB — ABO/RH: ABO/RH(D): A POS

## 2017-01-14 SURGERY — ARTHROPLASTY, HIP, TOTAL,POSTERIOR APPROACH
Anesthesia: Spinal | Laterality: Left | Wound class: Clean

## 2017-01-14 MED ORDER — MAGNESIUM HYDROXIDE 400 MG/5ML PO SUSP
30.0000 mL | Freq: Every day | ORAL | Status: DC | PRN
  Administered 2017-01-15 – 2017-01-17 (×3): 30 mL via ORAL
  Filled 2017-01-14 (×3): qty 30

## 2017-01-14 MED ORDER — PHENYLEPHRINE HCL 10 MG/ML IJ SOLN
INTRAMUSCULAR | Status: DC | PRN
  Administered 2017-01-14: 100 ug via INTRAVENOUS

## 2017-01-14 MED ORDER — ACETAMINOPHEN 10 MG/ML IV SOLN
1000.0000 mg | Freq: Four times a day (QID) | INTRAVENOUS | Status: AC
  Administered 2017-01-14 – 2017-01-15 (×4): 1000 mg via INTRAVENOUS
  Filled 2017-01-14 (×4): qty 100

## 2017-01-14 MED ORDER — FLEET ENEMA 7-19 GM/118ML RE ENEM: 1.0000 | ENEMA | Freq: Once | RECTAL | Status: DC | PRN

## 2017-01-14 MED ORDER — EPHEDRINE SULFATE 50 MG/ML IJ SOLN
INTRAMUSCULAR | Status: DC | PRN
  Administered 2017-01-14 (×3): 5 mg via INTRAVENOUS

## 2017-01-14 MED ORDER — ALUM & MAG HYDROXIDE-SIMETH 200-200-20 MG/5ML PO SUSP: 30.0000 mL | ORAL | Status: DC | PRN

## 2017-01-14 MED ORDER — PROPOFOL 500 MG/50ML IV EMUL
INTRAVENOUS | Status: DC | PRN
  Administered 2017-01-14: 50 ug/kg/min via INTRAVENOUS

## 2017-01-14 MED ORDER — ACETAMINOPHEN 10 MG/ML IV SOLN
INTRAVENOUS | Status: DC | PRN
  Administered 2017-01-14 (×2): 1000 mg via INTRAVENOUS

## 2017-01-14 MED ORDER — ONDANSETRON HCL 4 MG PO TABS: 4.0000 mg | ORAL_TABLET | Freq: Four times a day (QID) | ORAL | Status: DC | PRN

## 2017-01-14 MED ORDER — FENTANYL CITRATE (PF) 100 MCG/2ML IJ SOLN: 25.0000 ug | INTRAMUSCULAR | Status: DC | PRN

## 2017-01-14 MED ORDER — ONDANSETRON HCL 4 MG/2ML IJ SOLN: 4.0000 mg | Freq: Once | INTRAMUSCULAR | Status: DC | PRN

## 2017-01-14 MED ORDER — FENTANYL CITRATE (PF) 100 MCG/2ML IJ SOLN
INTRAMUSCULAR | Status: DC | PRN
  Administered 2017-01-14: 25 ug via INTRAVENOUS
  Administered 2017-01-14: 50 ug via INTRAVENOUS
  Administered 2017-01-14: 25 ug via INTRAVENOUS

## 2017-01-14 MED ORDER — MENTHOL 3 MG MT LOZG
1.0000 | LOZENGE | OROMUCOSAL | Status: DC | PRN
  Filled 2017-01-14: qty 9

## 2017-01-14 MED ORDER — TETRACAINE HCL 1 % IJ SOLN
INTRAMUSCULAR | Status: DC | PRN
  Administered 2017-01-14: 10 mg via INTRASPINAL

## 2017-01-14 MED ORDER — SODIUM CHLORIDE 0.9 % IV SOLN
INTRAVENOUS | Status: DC | PRN
  Administered 2017-01-14: 25 ug/min via INTRAVENOUS

## 2017-01-14 MED ORDER — PANTOPRAZOLE SODIUM 40 MG PO TBEC
40.0000 mg | DELAYED_RELEASE_TABLET | Freq: Every day | ORAL | Status: DC
  Administered 2017-01-15 – 2017-01-17 (×3): 40 mg via ORAL
  Filled 2017-01-14 (×3): qty 1

## 2017-01-14 MED ORDER — PHENOL 1.4 % MT LIQD
1.0000 | OROMUCOSAL | Status: DC | PRN
  Filled 2017-01-14: qty 177

## 2017-01-14 MED ORDER — NITROFURANTOIN MACROCRYSTAL 50 MG PO CAPS
50.0000 mg | ORAL_CAPSULE | Freq: Every day | ORAL | Status: DC
  Administered 2017-01-14 – 2017-01-17 (×4): 50 mg via ORAL
  Filled 2017-01-14 (×4): qty 1

## 2017-01-14 MED ORDER — CEFAZOLIN SODIUM-DEXTROSE 2-4 GM/100ML-% IV SOLN: 2.0000 g | Freq: Four times a day (QID) | INTRAVENOUS | Status: DC

## 2017-01-14 MED ORDER — PROPOFOL 500 MG/50ML IV EMUL
INTRAVENOUS | Status: AC
  Filled 2017-01-14: qty 50

## 2017-01-14 MED ORDER — POLYETHYLENE GLYCOL 3350 17 G PO PACK
17.0000 g | PACK | Freq: Every day | ORAL | Status: DC | PRN
  Filled 2017-01-14: qty 1

## 2017-01-14 MED ORDER — ROSUVASTATIN CALCIUM 10 MG PO TABS
5.0000 mg | ORAL_TABLET | ORAL | Status: DC
  Administered 2017-01-14 – 2017-01-16 (×2): 5 mg via ORAL
  Filled 2017-01-14 (×2): qty 1

## 2017-01-14 MED ORDER — OXYCODONE HCL 5 MG PO TABS
5.0000 mg | ORAL_TABLET | ORAL | Status: DC | PRN
  Administered 2017-01-14 – 2017-01-16 (×5): 10 mg via ORAL
  Filled 2017-01-14 (×6): qty 2

## 2017-01-14 MED ORDER — PROPOFOL 500 MG/50ML IV EMUL
INTRAVENOUS | Status: AC
Start: 2017-01-14 — End: 2017-01-14
  Filled 2017-01-14: qty 50

## 2017-01-14 MED ORDER — INSULIN ASPART 100 UNIT/ML ~~LOC~~ SOLN
0.0000 [IU] | Freq: Three times a day (TID) | SUBCUTANEOUS | Status: DC
  Administered 2017-01-14: 5 [IU] via SUBCUTANEOUS
  Administered 2017-01-15 (×2): 3 [IU] via SUBCUTANEOUS
  Administered 2017-01-15: 5 [IU] via SUBCUTANEOUS
  Administered 2017-01-16 – 2017-01-17 (×4): 3 [IU] via SUBCUTANEOUS
  Filled 2017-01-14: qty 8
  Filled 2017-01-14: qty 3
  Filled 2017-01-14: qty 5
  Filled 2017-01-14 (×4): qty 3
  Filled 2017-01-14: qty 5
  Filled 2017-01-14: qty 3

## 2017-01-14 MED ORDER — SODIUM CHLORIDE 0.9 % IV SOLN
INTRAVENOUS | Status: DC
  Administered 2017-01-14 – 2017-01-15 (×3): via INTRAVENOUS

## 2017-01-14 MED ORDER — ONDANSETRON HCL 4 MG/2ML IJ SOLN: 4.0000 mg | Freq: Four times a day (QID) | INTRAMUSCULAR | Status: DC | PRN

## 2017-01-14 MED ORDER — FINASTERIDE 5 MG PO TABS
5.0000 mg | ORAL_TABLET | Freq: Every day | ORAL | Status: DC
  Administered 2017-01-15 – 2017-01-17 (×3): 5 mg via ORAL
  Filled 2017-01-14 (×3): qty 1

## 2017-01-14 MED ORDER — TETRACAINE HCL 1 % IJ SOLN
INTRAMUSCULAR | Status: AC
  Filled 2017-01-14: qty 2

## 2017-01-14 MED ORDER — NEOMYCIN-POLYMYXIN B GU 40-200000 IR SOLN
Status: DC | PRN
  Administered 2017-01-14: 16 mL

## 2017-01-14 MED ORDER — FENTANYL CITRATE (PF) 100 MCG/2ML IJ SOLN
INTRAMUSCULAR | Status: AC
  Filled 2017-01-14: qty 2

## 2017-01-14 MED ORDER — MORPHINE SULFATE (PF) 2 MG/ML IV SOLN: 2.0000 mg | INTRAVENOUS | Status: DC | PRN

## 2017-01-14 MED ORDER — CEFAZOLIN SODIUM 10 G IJ SOLR
2.0000 g | Freq: Four times a day (QID) | INTRAMUSCULAR | Status: AC
  Administered 2017-01-14 – 2017-01-15 (×4): 2 g via INTRAVENOUS
  Filled 2017-01-14 (×4): qty 2000

## 2017-01-14 MED ORDER — METOCLOPRAMIDE HCL 10 MG PO TABS
10.0000 mg | ORAL_TABLET | Freq: Three times a day (TID) | ORAL | Status: AC
  Administered 2017-01-14 – 2017-01-16 (×7): 10 mg via ORAL
  Filled 2017-01-14 (×8): qty 1

## 2017-01-14 MED ORDER — ACETAMINOPHEN 650 MG RE SUPP: 650.0000 mg | Freq: Four times a day (QID) | RECTAL | Status: DC | PRN

## 2017-01-14 MED ORDER — LINAGLIPTIN 5 MG PO TABS
5.0000 mg | ORAL_TABLET | Freq: Every day | ORAL | Status: DC
  Administered 2017-01-14 – 2017-01-17 (×4): 5 mg via ORAL
  Filled 2017-01-14 (×4): qty 1

## 2017-01-14 MED ORDER — TRANEXAMIC ACID 1000 MG/10ML IV SOLN
1000.0000 mg | Freq: Once | INTRAVENOUS | Status: AC
  Administered 2017-01-14: 1000 mg via INTRAVENOUS
  Filled 2017-01-14: qty 10

## 2017-01-14 MED ORDER — ACETAMINOPHEN 325 MG PO TABS
650.0000 mg | ORAL_TABLET | Freq: Four times a day (QID) | ORAL | Status: DC | PRN
  Administered 2017-01-17: 650 mg via ORAL
  Filled 2017-01-14: qty 2

## 2017-01-14 MED ORDER — CEFAZOLIN SODIUM-DEXTROSE 2-4 GM/100ML-% IV SOLN
INTRAVENOUS | Status: AC
  Filled 2017-01-14: qty 100

## 2017-01-14 MED ORDER — BISACODYL 10 MG RE SUPP
10.0000 mg | Freq: Every day | RECTAL | Status: DC | PRN
  Administered 2017-01-17: 10 mg via RECTAL
  Filled 2017-01-14: qty 1

## 2017-01-14 MED ORDER — AMITRIPTYLINE HCL 10 MG PO TABS
20.0000 mg | ORAL_TABLET | Freq: Every day | ORAL | Status: DC
  Administered 2017-01-14 – 2017-01-16 (×3): 20 mg via ORAL
  Filled 2017-01-14 (×3): qty 2

## 2017-01-14 MED ORDER — SODIUM CHLORIDE 0.9 % IV SOLN
INTRAVENOUS | Status: DC
  Administered 2017-01-14 (×2): via INTRAVENOUS

## 2017-01-14 MED ORDER — FERROUS SULFATE 325 (65 FE) MG PO TABS
325.0000 mg | ORAL_TABLET | Freq: Two times a day (BID) | ORAL | Status: DC
  Administered 2017-01-14 – 2017-01-17 (×6): 325 mg via ORAL
  Filled 2017-01-14 (×6): qty 1

## 2017-01-14 MED ORDER — DIPHENHYDRAMINE HCL 12.5 MG/5ML PO ELIX: 12.5000 mg | ORAL_SOLUTION | ORAL | Status: DC | PRN

## 2017-01-14 MED ORDER — SENNOSIDES-DOCUSATE SODIUM 8.6-50 MG PO TABS
1.0000 | ORAL_TABLET | Freq: Two times a day (BID) | ORAL | Status: DC
  Administered 2017-01-14 – 2017-01-17 (×6): 1 via ORAL
  Filled 2017-01-14 (×6): qty 1

## 2017-01-14 MED ORDER — ENOXAPARIN SODIUM 30 MG/0.3ML ~~LOC~~ SOLN
30.0000 mg | Freq: Two times a day (BID) | SUBCUTANEOUS | Status: DC
Start: 2017-01-15 — End: 2017-01-17
  Administered 2017-01-15 – 2017-01-17 (×5): 30 mg via SUBCUTANEOUS
  Filled 2017-01-14 (×5): qty 0.3

## 2017-01-14 MED ORDER — CHLORHEXIDINE GLUCONATE 4 % EX LIQD: 60.0000 mL | Freq: Once | CUTANEOUS | Status: DC

## 2017-01-14 MED ORDER — BUPIVACAINE HCL (PF) 0.5 % IJ SOLN
INTRAMUSCULAR | Status: AC
  Filled 2017-01-14: qty 10

## 2017-01-14 MED ORDER — TRAMADOL HCL 50 MG PO TABS
50.0000 mg | ORAL_TABLET | ORAL | Status: DC | PRN
  Administered 2017-01-15 (×3): 100 mg via ORAL
  Filled 2017-01-14 (×3): qty 2
  Filled 2017-01-14: qty 1

## 2017-01-14 MED ORDER — BUPIVACAINE HCL (PF) 0.5 % IJ SOLN
INTRAMUSCULAR | Status: DC | PRN
  Administered 2017-01-14: 2 mL

## 2017-01-14 MED ORDER — TAMSULOSIN HCL 0.4 MG PO CAPS
0.4000 mg | ORAL_CAPSULE | Freq: Every day | ORAL | Status: DC
  Administered 2017-01-14 – 2017-01-17 (×4): 0.4 mg via ORAL
  Filled 2017-01-14 (×3): qty 1

## 2017-01-14 SURGICAL SUPPLY — 54 items
BLADE DRUM FLTD (BLADE) ×3 IMPLANT
BLADE SAW 1 (BLADE) ×3 IMPLANT
CANISTER SUCT 1200ML W/VALVE (MISCELLANEOUS) ×3 IMPLANT
CANISTER SUCT 3000ML PPV (MISCELLANEOUS) ×6 IMPLANT
CAPT HIP TOTAL 2 ×3 IMPLANT
CARTRIDGE OIL MAESTRO DRILL (MISCELLANEOUS) ×1 IMPLANT
CATH FOL LEG HOLDER (MISCELLANEOUS) ×3 IMPLANT
CATH TRAY METER 16FR LF (MISCELLANEOUS) ×3 IMPLANT
DIFFUSER MAESTRO (MISCELLANEOUS) ×3 IMPLANT
DRAPE INCISE IOBAN 66X60 STRL (DRAPES) ×3 IMPLANT
DRAPE SHEET LG 3/4 BI-LAMINATE (DRAPES) ×3 IMPLANT
DRSG DERMACEA 8X12 NADH (GAUZE/BANDAGES/DRESSINGS) ×3 IMPLANT
DRSG OPSITE POSTOP 3X4 (GAUZE/BANDAGES/DRESSINGS) ×3 IMPLANT
DRSG OPSITE POSTOP 4X12 (GAUZE/BANDAGES/DRESSINGS) ×3 IMPLANT
DRSG OPSITE POSTOP 4X14 (GAUZE/BANDAGES/DRESSINGS) ×3 IMPLANT
DRSG TEGADERM 4X4.75 (GAUZE/BANDAGES/DRESSINGS) ×3 IMPLANT
DURAPREP 26ML APPLICATOR (WOUND CARE) ×3 IMPLANT
ELECT BLADE 6.5 EXT (BLADE) ×3 IMPLANT
ELECT CAUTERY BLADE 6.4 (BLADE) ×3 IMPLANT
GLOVE BIOGEL M STRL SZ7.5 (GLOVE) ×6 IMPLANT
GLOVE BIOGEL PI IND STRL 9 (GLOVE) ×1 IMPLANT
GLOVE BIOGEL PI INDICATOR 9 (GLOVE) ×2
GLOVE INDICATOR 8.0 STRL GRN (GLOVE) ×3 IMPLANT
GLOVE SURG SYN 9.0  PF PI (GLOVE) ×2
GLOVE SURG SYN 9.0 PF PI (GLOVE) ×1 IMPLANT
GOWN STRL REUS W/ TWL LRG LVL3 (GOWN DISPOSABLE) ×2 IMPLANT
GOWN STRL REUS W/TWL 2XL LVL3 (GOWN DISPOSABLE) ×3 IMPLANT
GOWN STRL REUS W/TWL LRG LVL3 (GOWN DISPOSABLE) ×4
HEMOVAC 400CC 10FR (MISCELLANEOUS) ×3 IMPLANT
HOOD PEEL AWAY FLYTE STAYCOOL (MISCELLANEOUS) ×6 IMPLANT
KIT RM TURNOVER STRD PROC AR (KITS) ×3 IMPLANT
NDL SAFETY 18GX1.5 (NEEDLE) ×3 IMPLANT
NS IRRIG 500ML POUR BTL (IV SOLUTION) ×3 IMPLANT
OIL CARTRIDGE MAESTRO DRILL (MISCELLANEOUS) ×3
PACK HIP PROSTHESIS (MISCELLANEOUS) ×3 IMPLANT
PIN STEIN THRED 5/32 (Pin) ×3 IMPLANT
PULSAVAC PLUS IRRIG FAN TIP (DISPOSABLE) ×3
SOL .9 NS 3000ML IRR  AL (IV SOLUTION) ×2
SOL .9 NS 3000ML IRR UROMATIC (IV SOLUTION) ×1 IMPLANT
SOL PREP PVP 2OZ (MISCELLANEOUS) ×3
SOLUTION PREP PVP 2OZ (MISCELLANEOUS) ×1 IMPLANT
SPONGE DRAIN TRACH 4X4 STRL 2S (GAUZE/BANDAGES/DRESSINGS) ×3 IMPLANT
STAPLER SKIN PROX 35W (STAPLE) ×3 IMPLANT
SUT ETHIBOND #5 BRAIDED 30INL (SUTURE) ×3 IMPLANT
SUT VIC AB 0 CT1 36 (SUTURE) ×3 IMPLANT
SUT VIC AB 1 CT1 36 (SUTURE) ×6 IMPLANT
SUT VIC AB 2-0 CT1 27 (SUTURE) ×2
SUT VIC AB 2-0 CT1 TAPERPNT 27 (SUTURE) ×1 IMPLANT
SYR 20CC LL (SYRINGE) ×3 IMPLANT
TAPE ADH 3 LX (MISCELLANEOUS) ×3 IMPLANT
TAPE TRANSPORE STRL 2 31045 (GAUZE/BANDAGES/DRESSINGS) ×3 IMPLANT
TIP BRUSH PULSAVAC PLUS 24.33 (MISCELLANEOUS) ×2 IMPLANT
TIP FAN IRRIG PULSAVAC PLUS (DISPOSABLE) ×1 IMPLANT
TOWEL OR 17X26 4PK STRL BLUE (TOWEL DISPOSABLE) ×3 IMPLANT

## 2017-01-14 NOTE — Anesthesia Procedure Notes (Signed)
Procedure Name: MAC Performed by: Demetrius Charity Pre-anesthesia Checklist: Patient identified, Emergency Drugs available, Suction available, Patient being monitored and Timeout performed Oxygen Delivery Method: Simple face mask

## 2017-01-14 NOTE — Evaluation (Signed)
Physical Therapy Evaluation Patient Details Name: Derek Guerra MRN: [id number] DOB: September 26, 1931 Today's Date: 01/14/2017   History of Present Illness  Pt admitted for L THR.   Clinical Impression  Pt is a pleasant 83 year old male who was admitted for L THR. Pt performs bed mobility with min assist and transfers with max assist and unable to ambulate at this time secondary to weakness. Pt demonstrates deficits with strength/mobility/transfers. Pt is not at baseline level at this time. Would benefit from skilled PT to address above deficits and promote optimal return to PLOF; recommend transition to STR upon discharge from acute hospitalization.       Follow Up Recommendations SNF    Equipment Recommendations  Rolling walker with 5" wheels    Recommendations for Other Services       Precautions / Restrictions Precautions Precautions: Fall;Posterior Hip Precaution Booklet Issued: No Restrictions Weight Bearing Restrictions: Yes LLE Weight Bearing: Weight bearing as tolerated      Mobility  Bed Mobility Overal bed mobility: Needs Assistance Bed Mobility: Supine to Sit     Supine to sit: Min assist     General bed mobility comments: needs assist for sliding B LEs off bed. Needs cues to maintain hip precautions while seated as pt wants to lean forward and pull to slide to EOB.   Transfers Overall transfer level: Needs assistance Equipment used: Rolling walker (2 wheeled) Transfers: Sit to/from Stand Sit to Stand: Max assist         General transfer comment: 3 attempts for sit<>stand attempts with B LE buckling each attempt. Unable to achieve upright. Assisted to lower back to seated position on bed.  Ambulation/Gait             General Gait Details: unable at this time  Stairs            Wheelchair Mobility    Modified Rankin (Stroke Patients Only)       Balance Overall balance assessment: Needs assistance Sitting-balance support: Feet  supported Sitting balance-Leahy Scale: Good     Standing balance support: Bilateral upper extremity supported Standing balance-Leahy Scale: Fair                               Pertinent Vitals/Pain Pain Assessment: No/denies pain    Home Living Family/patient expects to be discharged to:: Private residence Living Arrangements: Alone   Type of Home: Independent living facility Home Access: Level entry     Home Layout: One level Home Equipment: Walker - 4 wheels;Grab bars - toilet;Grab bars - tub/shower      Prior Function Level of Independence: Independent with assistive device(s)         Comments: Uses Rollator to ambulate.  Denies any falls over the past 6 months.  Is independent with ADLs and IADLs. Household ambulator     Journalist, newspaper        Extremity/Trunk Assessment   Upper Extremity Assessment Upper Extremity Assessment: Overall WFL for tasks assessed    Lower Extremity Assessment Lower Extremity Assessment: Generalized weakness (R LE grossly 4/5; L LE grossly 3+/5)       Communication   Communication: No difficulties  Cognition Arousal/Alertness: Awake/alert Behavior During Therapy: WFL for tasks assessed/performed Overall Cognitive Status: Within Functional Limits for tasks assessed  General Comments      Exercises Other Exercises Other Exercises: supine ther-ex performed on B LE including ankle pumps, quad sets, SAQ, and hip abd/add. All ther-ex performed x 10 reps with min assist. Needs cues for correct technique.   Assessment/Plan    PT Assessment Patient needs continued PT services  PT Problem List Decreased strength;Decreased activity tolerance;Decreased balance;Decreased mobility;Decreased safety awareness;Pain       PT Treatment Interventions DME instruction;Gait training;Therapeutic activities;Therapeutic exercise    PT Goals (Current goals can be found in the  Care Plan section)  Acute Rehab PT Goals Patient Stated Goal: to get stronger PT Goal Formulation: With patient Time For Goal Achievement: 01/28/17 Potential to Achieve Goals: Good    Frequency BID   Barriers to discharge        Co-evaluation               AM-PAC PT "6 Clicks" Daily Activity  Outcome Measure Difficulty turning over in bed (including adjusting bedclothes, sheets and blankets)?: Total Difficulty moving from lying on back to sitting on the side of the bed? : Total Difficulty sitting down on and standing up from a chair with arms (e.g., wheelchair, bedside commode, etc,.)?: Total Help needed moving to and from a bed to chair (including a wheelchair)?: Total Help needed walking in hospital room?: Total Help needed climbing 3-5 steps with a railing? : Total 6 Click Score: 6    End of Session Equipment Utilized During Treatment: Gait belt Activity Tolerance: Patient tolerated treatment well Patient left: in bed;with bed alarm set Nurse Communication: Mobility status PT Visit Diagnosis: Pain;Unsteadiness on feet (R26.81);Muscle weakness (generalized) (M62.81) Pain - Right/Left: Left Pain - part of body: Hip    Time: 3903-0092 PT Time Calculation (min) (ACUTE ONLY): 25 min   Charges:   PT Evaluation $PT Eval Low Complexity: 1 Procedure PT Treatments $Therapeutic Exercise: 8-22 mins   PT G Codes:        Greggory Stallion, PT, DPT [phone]   Martasia Talamante 01/14/2017, 5:21 PM

## 2017-01-14 NOTE — Progress Notes (Signed)
Patient is A&O x4. Sensation decreased and was unable to get OOB by the end of this shift. Foley, hemovac, TEDs, and foot pumps in place. Tolerated clear liquid diet. No c/o pain. Dressing is CD&I. Bed alarm on for safety.

## 2017-01-14 NOTE — Anesthesia Post-op Follow-up Note (Cosign Needed)
Anesthesia QCDR form completed.        

## 2017-01-14 NOTE — H&P (Signed)
The patient has been re-examined, and the chart reviewed, and there have been no interval changes to the documented history and physical.    The risks, benefits, and alternatives have been discussed at length. The patient expressed understanding of the risks benefits and agreed with plans for surgical intervention.  James P. Hooten, Jr. M.D.    

## 2017-01-14 NOTE — Progress Notes (Signed)
Patient was admitted to room 146 from surgery via room bed and OR staff. POA from [place] at bedside.  He is staying until Saturday. A&O x 4. Hemovac, foley, TEDs and foot pumps in place. Bed alarm on for safety. Reviewed POC. IV fluids started.

## 2017-01-14 NOTE — Anesthesia Procedure Notes (Signed)
Spinal  Patient location during procedure: OR Start time: 01/14/2017 7:27 AM End time: 01/14/2017 7:32 AM Staffing Anesthesiologist: Martha Clan Resident/CRNA: Demetrius Charity Performed: resident/CRNA  Preanesthetic Checklist Completed: patient identified, site marked, surgical consent, pre-op evaluation, timeout performed, IV checked, risks and benefits discussed and monitors and equipment checked Spinal Block Patient position: sitting Prep: Betadine Patient monitoring: heart rate, continuous pulse ox, blood pressure and cardiac monitor Approach: midline Location: L3-4 Injection technique: single-shot Needle Needle type: Whitacre and Introducer  Needle gauge: 25 G Needle length: 9 cm Additional Notes Negative paresthesia. Negative blood return. Positive free-flowing CSF. Expiration date of kit checked and confirmed. Patient tolerated procedure well, without complications.

## 2017-01-14 NOTE — Transfer of Care (Signed)
Immediate Anesthesia Transfer of Care Note  Patient: Derek Guerra  Procedure(s) Performed: Procedure(s): TOTAL HIP ARTHROPLASTY (Left)  Patient Location: PACU  Anesthesia Type:Spinal  Level of Consciousness: sedated  Airway & Oxygen Therapy: Patient Spontanous Breathing and Patient connected to face mask oxygen  Post-op Assessment: Report given to RN and Post -op Vital signs reviewed and stable  Post vital signs: Reviewed and stable  Last Vitals:  Vitals:   01/14/17 0607  BP: (!) 163/107  Pulse: 100  Resp: 16  Temp: (!) 35.9 C    Last Pain:  Vitals:   01/14/17 0607  TempSrc: Tympanic         Complications: No apparent anesthesia complications

## 2017-01-14 NOTE — Op Note (Signed)
OPERATIVE NOTE  DATE OF SURGERY:  01/14/2017  PATIENT NAME:  Derek Guerra   DOB: [birth year]  MRN: [id number]  PRE-OPERATIVE DIAGNOSIS: Degenerative arthrosis of the left hip, primary  POST-OPERATIVE DIAGNOSIS:  Same  PROCEDURE:  Left total hip arthroplasty  SURGEON:  Marciano Sequin. M.D.  ASSISTANT:  Vance Peper, PA (present and scrubbed throughout the case, critical for assistance with exposure, retraction, instrumentation, and closure)  ANESTHESIA: spinal  ESTIMATED BLOOD LOSS: 300 mL  FLUIDS REPLACED: 1800 mL of crystalloid  DRAINS: 2 medium drains to a Hemovac reservoir  IMPLANTS UTILIZED: DePuy 19.5 mm small stature AML femoral stem, 60 mm OD Pinnacle 100 acetabular component, +4 mm 10 Pinnacle Marathon polyethylene insert, and a 36 mm M-SPEC +1.5 mm hip ball  INDICATIONS FOR SURGERY: Derek Guerra is a 83 y.o. year old male with a long history of progressive hip and groin  pain. X-rays demonstrated severe degenerative changes. The patient had not seen any significant improvement despite conservative nonsurgical intervention. After discussion of the risks and benefits of surgical intervention, the patient expressed understanding of the risks benefits and agree with plans for total hip arthroplasty.   The risks, benefits, and alternatives were discussed at length including but not limited to the risks of infection, bleeding, nerve injury, stiffness, blood clots, the need for revision surgery, limb length inequality, dislocation, cardiopulmonary complications, among others, and they were willing to proceed.  PROCEDURE IN DETAIL: The patient was brought into the operating room and, after adequate spinal anesthesia was achieved, the patient was placed in a right lateral decubitus position. Axillary roll was placed and all bony prominences were well-padded. The patient's left hip was cleaned and prepped with alcohol and DuraPrep and draped in the usual sterile fashion. A "timeout" was  performed as per usual protocol. A lateral curvilinear incision was made gently curving towards the posterior superior iliac spine. The IT band was incised in line with the skin incision and the fibers of the gluteus maximus were split in line. The piriformis tendon was identified, skeletonized, and incised at its insertion to the proximal femur and reflected posteriorly. A T type posterior capsulotomy was performed. Prior to dislocation of the femoral head, a threaded Steinmann pin was inserted through a separate stab incision into the pelvis superior to the acetabulum and bent in the form of a stylus so as to assess limb length and hip offset throughout the procedure. The femoral head was then dislocated posteriorly. Inspection of the femoral head demonstrated severe degenerative changes with full-thickness loss of articular cartilage. The femoral neck cut was performed using an oscillating saw. The anterior capsule was elevated off of the femoral neck using a periosteal elevator. Attention was then directed to the acetabulum. The remnant of the labrum was excised using electrocautery. Inspection of the acetabulum also demonstrated significant degenerative changes. The acetabulum was reamed in sequential fashion up to a 59 mm diameter. Good punctate bleeding bone was encountered. A 60 mm Pinnacle 100 acetabular component was positioned and impacted into place. Good scratch fit was appreciated. A +4 mm neutral polyethylene trial was inserted.  Attention was then directed to the proximal femur. A hole for reaming of the proximal femoral canal was created using a high-speed burr. The femoral canal was reamed in sequential fashion up to a 19.5 mm diameter to allow for a line to line fit. Serial broaches were inserted up to a 19.5 mm small stature femoral broach. Calcar region was planed and a trial reduction was  performed using a 36 mm hip ball with a +1.5 mm neck length. Reasonably good stability was noted with the  neutral liner, but it was elected to trial with a +4 mm 10 liner with the high side positioned at the 4:00 position. This allowed for improved posterior stability. Good equalization of limb lengths and hip offset was appreciated and excellent stability was noted both anteriorly and posteriorly. Trial components were removed. The acetabular shell was irrigated with copious amounts of normal saline with antibiotic solution and suctioned dry. A +4 mm 10 Pinnacle Marathon polyethylene insert was positioned with the high side at the 4:00 position and impacted into place. Next, a 19.5 mm small stature AML femoral stem was positioned and impacted into place. Excellent scratch fit was appreciated. A trial reduction was again performed with a 36 mm hip ball with a +1.5 mm neck length. Again, good equalization of limb lengths was appreciated and excellent stability appreciated both anteriorly and posteriorly. The hip was then dislocated and the trial hip ball was removed. The Morse taper was cleaned and dried. A 36 mm M-SPEC hip ball with a +1.5 mm neck length was placed on the trunnion and impacted into place. The hip was then reduced and placed through range of motion. Excellent stability was appreciated both anteriorly and posteriorly.  The wound was irrigated with copious amounts of normal saline with antibiotic solution and suctioned dry. Good hemostasis was appreciated. The posterior capsulotomy was repaired using #5 Ethibond. Piriformis tendon was reapproximated to the undersurface of the gluteus medius tendon using #5 Ethibond. Two medium drains were placed in the wound bed and brought out through separate stab incisions to be attached to a Hemovac reservoir. The IT band was reapproximated using interrupted sutures of #1 Vicryl. Subcutaneous tissue was approximated using first #0 Vicryl followed by #2-0 Vicryl. The skin was closed with skin staples.  The patient tolerated the procedure well and was transported  to the recovery room in stable condition.   Marciano Sequin., M.D.

## 2017-01-14 NOTE — Anesthesia Preprocedure Evaluation (Addendum)
Anesthesia Evaluation  Patient identified by MRN, date of birth, ID band Patient awake    Reviewed: Allergy & Precautions, H&P , NPO status , Patient's Chart, lab work & pertinent test results, reviewed documented beta blocker date and time   History of Anesthesia Complications Negative for: history of anesthetic complications  Airway Mallampati: I  TM Distance: >3 FB Neck ROM: full    Dental  (+) Dental Advidsory Given, Teeth Intact   Pulmonary neg pulmonary ROS,           Cardiovascular Exercise Tolerance: Good hypertension, (-) angina+ Peripheral Vascular Disease  (-) CAD, (-) Past MI, (-) Cardiac Stents and (-) CABG (-) dysrhythmias + Valvular Problems/Murmurs      Neuro/Psych negative neurological ROS  negative psych ROS   GI/Hepatic negative GI ROS, Neg liver ROS,   Endo/Other  diabetes  Renal/GU CRFRenal disease  negative genitourinary   Musculoskeletal   Abdominal   Peds  Hematology negative hematology ROS (+)   Anesthesia Other Findings Past Medical History: No date: Chronic kidney disease No date: Diabetes mellitus without complication (HCC) No date: Prostate cancer (HCC) No date: Urinary incontinence   Reproductive/Obstetrics negative OB ROS                             Anesthesia Physical Anesthesia Plan  ASA: II  Anesthesia Plan: Spinal   Post-op Pain Management:    Induction:   Airway Management Planned:   Additional Equipment:   Intra-op Plan:   Post-operative Plan:   Informed Consent: I have reviewed the patients History and Physical, chart, labs and discussed the procedure including the risks, benefits and alternatives for the proposed anesthesia with the patient or authorized representative who has indicated his/her understanding and acceptance.   Dental Advisory Given  Plan Discussed with: Anesthesiologist, CRNA and Surgeon  Anesthesia Plan Comments:          Anesthesia Quick Evaluation

## 2017-01-14 NOTE — Care Management Note (Signed)
Case Management Note  Patient Details  Name: Stephan Nelis MRN: [id number] Date of Birth: May 28, 1932  Subjective/Objective:   RNCM referral for home health needs. PT pending. Following progression.                  Action/Plan:   Expected Discharge Date:  01/16/17               Expected Discharge Plan:     In-House Referral:     Discharge planning Services  CM Consult  Post Acute Care Choice:    Choice offered to:     DME Arranged:    DME Agency:     HH Arranged:    HH Agency:     Status of Service:  In process, will continue to follow  If discussed at Long Length of Stay Meetings, dates discussed:    Additional Comments:  Jolly Mango, RN 01/14/2017, 2:20 PM

## 2017-01-15 LAB — CBC
HEMATOCRIT: 33.5 % — AB (ref 40.0–52.0)
Hemoglobin: 11.7 g/dL — ABNORMAL LOW (ref 13.0–18.0)
MCH: 33.4 pg (ref 26.0–34.0)
MCHC: 34.8 g/dL (ref 32.0–36.0)
MCV: 95.9 fL (ref 80.0–100.0)
Platelets: 118 10*3/uL — ABNORMAL LOW (ref 150–440)
RBC: 3.49 MIL/uL — ABNORMAL LOW (ref 4.40–5.90)
RDW: 13.2 % (ref 11.5–14.5)
WBC: 6.4 10*3/uL (ref 3.8–10.6)

## 2017-01-15 LAB — BASIC METABOLIC PANEL
Anion gap: 4 — ABNORMAL LOW (ref 5–15)
BUN: 22 mg/dL — AB (ref 6–20)
CHLORIDE: 106 mmol/L (ref 101–111)
CO2: 27 mmol/L (ref 22–32)
CREATININE: 1.39 mg/dL — AB (ref 0.61–1.24)
Calcium: 8.3 mg/dL — ABNORMAL LOW (ref 8.9–10.3)
GFR calc Af Amer: 53 mL/min — ABNORMAL LOW (ref >60)
GFR calc non Af Amer: 46 mL/min — ABNORMAL LOW (ref >60)
Glucose, Bld: 191 mg/dL — ABNORMAL HIGH (ref 65–99)
Potassium: 3.5 mmol/L (ref 3.5–5.1)
Sodium: 137 mmol/L (ref 135–145)

## 2017-01-15 LAB — GLUCOSE, CAPILLARY
Glucose-Capillary: 177 mg/dL — ABNORMAL HIGH (ref 65–99)
Glucose-Capillary: 173 mg/dL — ABNORMAL HIGH (ref 65–99)
Glucose-Capillary: 223 mg/dL — ABNORMAL HIGH (ref 65–99)
Glucose-Capillary: 150 mg/dL — ABNORMAL HIGH (ref 65–99)

## 2017-01-15 NOTE — Discharge Instructions (Signed)

## 2017-01-15 NOTE — Evaluation (Signed)
Occupational Therapy Evaluation Patient Details Name: Derek Guerra MRN: [id number] DOB: 1932/03/23 Today's Date: 01/15/2017    History of Present Illness Pt admitted for L THR.    Clinical Impression   Pt seen for OT evaluation this date. Pt was independent with ADL, IADL prior to admission, using rollator for ambulation, lives alone in [facility]). Of note, pt's spouse, Rod Holler, recently passed away in Oct 14, 2022 (pt became slightly tearful when talking about her during session). Pt enjoys composing music and playing piano. Pt endorses short term memory difficulties, impacting his medication mgt, stating "I easily sometimes forget" to take his medications. Pt has assistance for household tasks 2x/mo. Pt presents with impairments in decreased strength/ROM/knowledge of precautions/knowledge of AE/DME for self care which impact his ability to perform self care tasks. Pt is eager to return to PLOF and will benefit from skilled OT services to address noted impairments and functional deficits including education/training in AE for LB dressing, medication mgt strategies, education/review of posterior hip precautions, and home/routines modifications to maximize return to PLOF and minimize falls risk.     Follow Up Recommendations  SNF    Equipment Recommendations  3 in 1 bedside commode    Recommendations for Other Services       Precautions / Restrictions Precautions Precautions: Fall;Posterior Hip Precaution Booklet Issued: No Precaution Comments: able to recall 2/3, required verbal cue for no internal rotation Restrictions Weight Bearing Restrictions: Yes LLE Weight Bearing: Weight bearing as tolerated      Mobility Bed Mobility Overal bed mobility: Needs Assistance Bed Mobility: Supine to Sit;Sit to Supine     Supine to sit: Min guard;Min assist;HOB elevated Sit to supine: Mod assist   General bed mobility comments: min assist for scooting EOB during sup>sit; required assist for  BLE for sit>supine and verbal cues for hip precautions  Transfers Overall transfer level: Needs assistance Equipment used: Rolling walker (2 wheeled) Transfers: Sit to/from Stand Sit to Stand: Min assist         General transfer comment: verbal cues for hand placement to maximize safety and maintain hip precautions    Balance Overall balance assessment: Needs assistance Sitting-balance support: Feet supported Sitting balance-Leahy Scale: Good   Postural control: Posterior lean Standing balance support: Bilateral upper extremity supported Standing balance-Leahy Scale: Fair                             ADL either performed or assessed with clinical judgement   ADL Overall ADL's : Needs assistance/impaired Eating/Feeding: Bed level;Set up   Grooming: Sitting;Set up;Supervision/safety   Upper Body Bathing: Min guard;Sitting;Set up   Lower Body Bathing: Maximal assistance;Bed level   Upper Body Dressing : Set up;Sitting;Min guard   Lower Body Dressing: Maximal assistance;Bed level     Toilet Transfer Details (indicate cue type and reason): did not assess due to pt safety         Functional mobility during ADLs: Rolling walker (sit<>stand transfers requiring min assist with RW and VC for hand placement to maintain hip precautions) General ADL Comments: generally max assist for LB ADL     Vision   Eye Alignment: Within Functional Limits     Perception     Praxis      Pertinent Vitals/Pain Pain Assessment: 0-10 Pain Score: 6  Pain Location: L hip Pain Descriptors / Indicators: Aching;Grimacing;Operative site guarding Pain Intervention(s): Limited activity within patient's tolerance;RN gave pain meds during session;Monitored during session;Repositioned;Ice applied  Hand Dominance Right   Extremity/Trunk Assessment Upper Extremity Assessment Upper Extremity Assessment: Overall WFL for tasks assessed   Lower Extremity Assessment Lower  Extremity Assessment: Defer to PT evaluation   Cervical / Trunk Assessment Cervical / Trunk Assessment: Kyphotic   Communication Communication Communication: HOH (pt endorses slight trouble hearing out of L ear)   Cognition Arousal/Alertness: Awake/alert Behavior During Therapy: WFL for tasks assessed/performed Overall Cognitive Status: Within Functional Limits for tasks assessed                                 General Comments: Pt endorses STM difficulties at baseline from childhood, this appears to be impacting his ability to manage medications independently, as he reports he "sometimes forgets"   General Comments       Exercises     Shoulder Instructions      Home Living Family/patient expects to be discharged to:: Private residence Living Arrangements: Alone (spouse, Rod Holler, passed away in 11-04-2016) Available Help at Discharge:  (pt reports cousin's husband is in town for a short period visiting) [facility] Home Access: Level entry     Home Layout: One level     Bathroom Shower/Tub: Walk-in Hydrologist: Handicapped height Bathroom Accessibility: Yes How Accessible: Accessible via walker Home Equipment: Walker - 4 wheels;Grab bars - toilet;Grab bars - tub/shower   Additional Comments: pt reports having Nu Step      Prior Functioning/Environment Level of Independence: Independent with assistive device(s)        Comments: Uses Rollator to ambulate.  Denies any falls over the past 12 months.  Is independent with ADL and IADL including driving, meal prep. Household ambulator. Assist w/ cleaning 2x/mo; endorses "easily forgets" medications due to baseline STM difficulties; pt enjoys composing music and plays the piano, uses Nu Step 2x/day for 15 mins        OT Problem List: Pain;Decreased range of motion;Decreased safety awareness;Decreased knowledge of use of DME or AE;Decreased  knowledge of precautions;Impaired balance (sitting and/or standing)      OT Treatment/Interventions: Self-care/ADL training;Therapeutic exercise;Therapeutic activities;Energy conservation;DME and/or AE instruction;Patient/family education    OT Goals(Current goals can be found in the care plan section) Acute Rehab OT Goals Patient Stated Goal: to get stronger OT Goal Formulation: With patient Time For Goal Achievement: 01/29/17 Potential to Achieve Goals: Good  OT Frequency: Min 2X/week   Barriers to D/C: Decreased caregiver support          Co-evaluation              AM-PAC PT "6 Clicks" Daily Activity     Outcome Measure Help from another person eating meals?: None Help from another person taking care of personal grooming?: None Help from another person toileting, which includes using toliet, bedpan, or urinal?: A Lot Help from another person bathing (including washing, rinsing, drying)?: A Lot Help from another person to put on and taking off regular upper body clothing?: A Little Help from another person to put on and taking off regular lower body clothing?: A Lot 6 Click Score: 17   End of Session Equipment Utilized During Treatment: Gait belt;Rolling walker Nurse Communication: Other (comment) (notified RN of pt's dizziness/nausea upon standing, BP checked, 141/80)  Activity Tolerance: Patient tolerated treatment well Patient left: in bed;with call bell/phone within reach;with bed alarm set;with nursing/sitter in room;with SCD's reapplied  OT Visit Diagnosis:  Other abnormalities of gait and mobility (R26.89);Pain Pain - Right/Left: Left Pain - part of body: Hip                Time: 0012-3935 OT Time Calculation (min): 48 min Charges:  OT General Charges $OT Visit: 1 Procedure OT Evaluation $OT Eval Low Complexity: 1 Procedure OT Treatments $Self Care/Home Management : 23-37 mins G-Codes:     Jeni Salles, MPH, MS, OTR/L ascom [phone] 01/15/17, 10:14  AM

## 2017-01-15 NOTE — NC FL2 (Signed)
[place] LEVEL OF CARE SCREENING TOOL     IDENTIFICATION  Patient Name: Derek Guerra Birthdate: 1931/12/08 Sex: male Admission Date (Current Location): 01/14/2017  [place] and [place] Number:  Engineering geologist and Address:  [facility], [address] [place], [place] [address]      Provider Number: [id number]  Attending Physician Name and Address:  [name], MD  Relative Name and Phone Number:       Current Level of Care: Hospital Recommended Level of Care: Walker Prior Approval Number:    Date Approved/Denied:   PASRR Number: [id number]  Discharge Plan: SNF    Current Diagnoses: Patient Active Problem List   Diagnosis Date Noted  . Status post total replacement of hip 01/14/2017  . Ileus ([place]) 11/28/2016  . Acute on chronic renal failure ([place]) 11/28/2016  . DKA (diabetic ketoacidoses) ([place]) 11/28/2016  . Leukopenia 11/28/2016  . Thrombocytopenia ([place]) 11/28/2016  . Generalized weakness 11/28/2016  . Dehydration 11/28/2016  . Acute urinary retention 11/28/2016  . Enteritis 11/26/2016  . Unsteadiness 08/22/2015  . Beat, premature ventricular 02/14/2015  . Benign essential HTN 02/02/2015  . Bladder infection, chronic 10/10/2014  . Incomplete bladder emptying 10/10/2014  . Atherosclerosis of aorta ([place]) 08/14/2014  . Diabetes mellitus ([place]) 08/14/2014  . History of cardiac catheterization 08/14/2014  . Left ventricular dysfunction 08/14/2014  . Combined fat and carbohydrate induced hyperlipemia 08/14/2014  . Peripheral vascular disease ([place]) 08/14/2014  . Disorder of kidney and ureter 08/14/2014  . Type 2 diabetes mellitus with diabetic chronic kidney disease ([place]) 03/27/2014  . Acontractile bladder 03/23/2014  . H/O malignant neoplasm of prostate 03/20/2014  . BP (high blood pressure) 03/10/2013  . Type 2 diabetes mellitus ([place]) 02/16/2013  . Abnormal gait 02/25/2011  . Cannot sleep  11/21/2010    Orientation RESPIRATION BLADDER Height & Weight     Self, Time, Situation, Place  Normal Continent Weight: 142 lb (64.4 kg) Height:  5\' 10"  (177.8 cm)  BEHAVIORAL SYMPTOMS/MOOD NEUROLOGICAL BOWEL NUTRITION STATUS   (none)  (none) Continent Diet (carb modified)  AMBULATORY STATUS COMMUNICATION OF NEEDS Skin   Limited Assist Verbally Normal                       Personal Care Assistance Level of Assistance  Bathing, Dressing Bathing Assistance: Limited assistance   Dressing Assistance: Limited assistance     Functional Limitations Info  Hearing   Hearing Info: Impaired      SPECIAL CARE FACTORS FREQUENCY  PT (By licensed PT)                    Contractures Contractures Info: Not present    Additional Factors Info  Code Status Code Status Info: full             Current Medications (01/15/2017):  This is the current hospital active medication list Current Facility-Administered Medications  Medication Dose Route Frequency Provider Last Rate Last Dose  . 0.9 %  sodium chloride infusion   Intravenous Continuous Hooten, Laurice Record, MD 100 mL/hr at 01/15/17 1416    . acetaminophen (TYLENOL) tablet 650 mg  650 mg Oral Q6H PRN Hooten, Laurice Record, MD       Or  . acetaminophen (TYLENOL) suppository 650 mg  650 mg Rectal Q6H PRN Hooten, Laurice Record, MD      . alum & mag hydroxide-simeth (MAALOX/MYLANTA) 200-200-20 MG/5ML suspension 30 mL  30 mL  Oral Q4H PRN Hooten, Laurice Record, MD      . amitriptyline (ELAVIL) tablet 20 mg  20 mg Oral QHS Hooten, Laurice Record, MD   20 mg at 01/14/17 2056  . bisacodyl (DULCOLAX) suppository 10 mg  10 mg Rectal Daily PRN Hooten, Laurice Record, MD      . diphenhydrAMINE (BENADRYL) 12.5 MG/5ML elixir 12.5-25 mg  12.5-25 mg Oral Q4H PRN Hooten, Laurice Record, MD      . enoxaparin (LOVENOX) injection 30 mg  30 mg Subcutaneous Q12H Hooten, Laurice Record, MD   30 mg at 01/15/17 0757  . ferrous sulfate tablet 325 mg  325 mg Oral BID WC Hooten, Laurice Record, MD   325 mg  at 01/15/17 0757  . finasteride (PROSCAR) tablet 5 mg  5 mg Oral Daily Hooten, Laurice Record, MD   5 mg at 01/15/17 6811  . insulin aspart (novoLOG) injection 0-15 Units  0-15 Units Subcutaneous TID WC Hooten, Laurice Record, MD   3 Units at 01/15/17 1150  . linagliptin (TRADJENTA) tablet 5 mg  5 mg Oral Daily Hooten, Laurice Record, MD   5 mg at 01/15/17 5726  . magnesium hydroxide (MILK OF MAGNESIA) suspension 30 mL  30 mL Oral Daily PRN [name], MD   30 mL at 01/15/17 0922  . menthol-cetylpyridinium (CEPACOL) lozenge 3 mg  1 lozenge Oral PRN Hooten, Laurice Record, MD       Or  . phenol (CHLORASEPTIC) mouth spray 1 spray  1 spray Mouth/Throat PRN Hooten, Laurice Record, MD      . metoCLOPramide (REGLAN) tablet 10 mg  10 mg Oral TID AC & HS Hooten, Laurice Record, MD   10 mg at 01/15/17 1150  . morphine 2 MG/ML injection 2 mg  2 mg Intravenous Q2H PRN Hooten, Laurice Record, MD      . nitrofurantoin (MACRODANTIN) capsule 50 mg  50 mg Oral Daily Hooten, Laurice Record, MD   50 mg at 01/15/17 2035  . ondansetron (ZOFRAN) tablet 4 mg  4 mg Oral Q6H PRN Hooten, Laurice Record, MD       Or  . ondansetron (ZOFRAN) injection 4 mg  4 mg Intravenous Q6H PRN Hooten, Laurice Record, MD      . oxyCODONE (Oxy IR/ROXICODONE) immediate release tablet 5-10 mg  5-10 mg Oral Q4H PRN Hooten, Laurice Record, MD   10 mg at 01/15/17 0510  . pantoprazole (PROTONIX) EC tablet 40 mg  40 mg Oral Daily Hooten, Laurice Record, MD   40 mg at 01/15/17 5974  . polyethylene glycol (MIRALAX / GLYCOLAX) packet 17 g  17 g Oral Daily PRN Hooten, Laurice Record, MD      . rosuvastatin (CRESTOR) tablet 5 mg  5 mg Oral Q M,W,F-1800 Hooten, Laurice Record, MD   5 mg at 01/14/17 1753  . senna-docusate (Senokot-S) tablet 1 tablet  1 tablet Oral BID [name], MD   1 tablet at 01/15/17 [phone]  . sodium phosphate (FLEET) 7-19 GM/118ML enema 1 enema  1 enema Rectal Once PRN Hooten, Laurice Record, MD      . tamsulosin (FLOMAX) capsule 0.4 mg  0.4 mg Oral QPC breakfast Hooten, Laurice Record, MD   0.4 mg at 01/15/17 0827  . traMADol  (ULTRAM) tablet 50-100 mg  50-100 mg Oral Q4H PRN [name], MD   100 mg at 01/15/17 1332     Discharge Medications: Please see discharge summary for a list of discharge medications.  Relevant Imaging Results:  Relevant Lab Results:   Additional Information ss [id number]  Shela Leff, LCSW

## 2017-01-15 NOTE — Progress Notes (Signed)
   Subjective: 1 Day Post-Op Procedure(s) (LRB): TOTAL HIP ARTHROPLASTY (Left) Patient reports pain as severe.  Patient rates his pain between 3 and 6 since surgery. However patient this morning states that he had a terrible night. He states it he has severe pain all night the records indicate that he only total to oxycodone since surgery. This was 8:00 yesterday evening and 5:00 this morning. Patient is well, and has had no acute complaints or problems Physical therapy was initiated yesterday. Bed modalities were performed. Was unable to ambulate. However patient states he had no therapy. Plan is to go Rehab after hospital stay. no nausea and no vomiting Patient denies any chest pains or shortness of breath. Objective: Vital signs in last 24 hours: Temp:  [92.2 F (33.4 C)-98.7 F (37.1 C)] 98.2 F (36.8 C) (05/10 0431) Pulse Rate:  [57-72] 63 (05/10 0431) Resp:  [10-18] 16 (05/10 0431) BP: (109-177)/(58-92) 142/68 (05/10 0431) SpO2:  [96 %-100 %] 96 % (05/10 0431) Weight:  [64.4 kg (142 lb)] 64.4 kg (142 lb) (05/09 1215) well approximated incision Heels are non tender and elevated off the bed using rolled towels Intake/Output from previous day: 05/09 0701 - 05/10 0700 In: 4386.7 [I.V.:3576.7; IV Piggyback:810] Out: 2400 [Urine:1580; Drains:520; Blood:300] Intake/Output this shift: No intake/output data recorded.   Recent Labs  01/15/17 0520  HGB 11.7*    Recent Labs  01/15/17 0520  WBC 6.4  RBC 3.49*  HCT 33.5*  PLT 118*    Recent Labs  01/15/17 0520  NA 137  K 3.5  CL 106  CO2 27  BUN 22*  CREATININE 1.39*  GLUCOSE 191*  CALCIUM 8.3*   No results for input(s): LABPT, INR in the last 72 hours.  EXAM General - Patient is Alert, Appropriate and Oriented Extremity - Neurologically intact Neurovascular intact Sensation intact distally Intact pulses distally Dorsiflexion/Plantar flexion intact No cellulitis present Compartment soft Dressing - dressing  C/D/I Motor Function - intact, moving foot and toes well on exam.    Past Medical History:  Diagnosis Date  . Chronic kidney disease   . Diabetes mellitus without complication ([place])   . Prostate cancer ([place])   . Urinary incontinence     Assessment/Plan: 1 Day Post-Op Procedure(s) (LRB): TOTAL HIP ARTHROPLASTY (Left) Active Problems:   Status post total replacement of hip  Estimated body mass index is 20.37 kg/m as calculated from the following:   Height as of this encounter: 5\' 10"  (1.778 m).   Weight as of this encounter: 64.4 kg (142 lb). Advance diet Up with therapy D/C IV fluids Discharge to SNF  Labs: Were reviewed and felt to be acceptable DVT Prophylaxis - Lovenox, Foot Pumps and TED hose Weight-Bearing as tolerated to left leg D/C O2 and Pulse OX and try on Room Air Begin working on bowel movement Labs tomorrow morning  Keishana Klinger R. [place] 01/15/2017, 7:08 AM

## 2017-01-15 NOTE — Discharge Summary (Signed)
Physician Discharge Summary  Patient ID: Derek Guerra MRN: [id number] DOB/AGE: [id number] y.o.  Admit date: 01/14/2017 Discharge date: 01/17/2017  Admission Diagnoses:  PRIMARY OSTEOARTHRITIS OF LEFT HIP   Discharge Diagnoses: Patient Active Problem List   Diagnosis Date Noted  . Status post total replacement of hip 01/14/2017  . Ileus ([place]) 11/28/2016  . Acute on chronic renal failure ([place]) 11/28/2016  . DKA (diabetic ketoacidoses) ([place]) 11/28/2016  . Leukopenia 11/28/2016  . Thrombocytopenia ([place]) 11/28/2016  . Generalized weakness 11/28/2016  . Dehydration 11/28/2016  . Acute urinary retention 11/28/2016  . Enteritis 11/26/2016  . Unsteadiness 08/22/2015  . Beat, premature ventricular 02/14/2015  . Benign essential HTN 02/02/2015  . Bladder infection, chronic 10/10/2014  . Incomplete bladder emptying 10/10/2014  . Atherosclerosis of aorta ([place]) 08/14/2014  . Diabetes mellitus ([place]) 08/14/2014  . History of cardiac catheterization 08/14/2014  . Left ventricular dysfunction 08/14/2014  . Combined fat and carbohydrate induced hyperlipemia 08/14/2014  . Peripheral vascular disease ([place]) 08/14/2014  . Disorder of kidney and ureter 08/14/2014  . Type 2 diabetes mellitus with diabetic chronic kidney disease ([place]) 03/27/2014  . Acontractile bladder 03/23/2014  . H/O malignant neoplasm of prostate 03/20/2014  . BP (high blood pressure) 03/10/2013  . Type 2 diabetes mellitus ([place]) 02/16/2013  . Abnormal gait 02/25/2011  . Cannot sleep 11/21/2010    Past Medical History:  Diagnosis Date  . Chronic kidney disease   . Diabetes mellitus without complication ([place])   . Prostate cancer ([place])   . Urinary incontinence      Transfusion: No transfusions during this admission   Consultants (if any):   Discharged Condition: Improved  Hospital Course: Derek Guerra is an 83 y.o. male who was admitted 01/14/2017 with a diagnosis of degenerative arthrosis left hip and went to the  operating room on 01/14/2017 and underwent the above named procedures.    Surgeries:Procedure(s): TOTAL HIP ARTHROPLASTY on 01/14/2017  PRE-OPERATIVE DIAGNOSIS: Degenerative arthrosis of the left hip, primary  POST-OPERATIVE DIAGNOSIS:  Same  PROCEDURE:  Left total hip arthroplasty  SURGEON:  Marciano Sequin. M.D.  ASSISTANT:  Vance Peper, PA (present and scrubbed throughout the case, critical for assistance with exposure, retraction, instrumentation, and closure)  ANESTHESIA: spinal  ESTIMATED BLOOD LOSS: 300 mL  FLUIDS REPLACED: 1800 mL of crystalloid  DRAINS: 2 medium drains to a Hemovac reservoir  IMPLANTS UTILIZED: DePuy 19.5 mm small stature AML femoral stem, 60 mm OD Pinnacle 100 acetabular component, +4 mm 10 Pinnacle Marathon polyethylene insert, and a 36 mm M-SPEC +1.5 mm hip ball  INDICATIONS FOR SURGERY: Derek Guerra is a 83 y.o. year old male with a long history of progressive hip and groin  pain. X-rays demonstrated severe degenerative changes. The patient had not seen any significant improvement despite conservative nonsurgical intervention. After discussion of the risks and benefits of surgical intervention, the patient expressed understanding of the risks benefits and agree with plans for total hip arthroplasty.   The risks, benefits, and alternatives were discussed at length including but not limited to the risks of infection, bleeding, nerve injury, stiffness, blood clots, the need for revision surgery, limb length inequality, dislocation, cardiopulmonary complications, among others, and they were willing to proceed.  Patient tolerated the surgery well. No complications .Patient was taken to PACU where she was stabilized and then transferred to the orthopedic floor.  Patient started on Lovenox 30 mg q 12 hrs. Foot pumps applied bilaterally at 80 mm hgb. Heels elevated off bed with rolled  towels. No evidence of DVT. Calves non tender. Negative  [place]. Physical therapy started on day #1 for gait training and transfer with OT starting on  day #1 for ADL and assisted devices. Patient has done well with therapy. Ambulated 80 feet upon being discharged.  Patient's IV And Foley were discontinued on day #1 with Hemovac being discontinued on day #2. Dressing was changed on day 2   On postop day 3, patient was stable and ready for discharge to skilled nursing facility.   He was given perioperative antibiotics:  Anti-infectives    Start     Dose/Rate Route Frequency Ordered Stop   01/14/17 1400  ceFAZolin (ANCEF) 2 g in dextrose 5 % 100 mL IVPB     2 g 200 mL/hr over 30 Minutes Intravenous Every 6 hours 01/14/17 1230 01/15/17 0827   01/14/17 1230  ceFAZolin (ANCEF) IVPB 2g/100 mL premix  Status:  Discontinued     2 g 200 mL/hr over 30 Minutes Intravenous Every 6 hours 01/14/17 1220 01/14/17 1230   01/14/17 0615  ceFAZolin (ANCEF) 2-4 GM/100ML-% IVPB    Comments:  Derek Guerra: cabinet override      01/14/17 0615 01/14/17 0743   01/14/17 0600  ceFAZolin (ANCEF) IVPB 2g/100 mL premix     2 g 200 mL/hr over 30 Minutes Intravenous On call to O.R. 01/13/17 2238 01/14/17 2694    .  He was fitted with AV 1 compression foot pump devices, instructed on heel pumps, early ambulation, and fitted with TED stockings bilaterally for DVT prophylaxis.  He benefited maximally from the hospital stay and there were no complications.    Recent vital signs:  Vitals:   01/16/17 2303 01/17/17 0740  BP: 118/62 (!) 150/72  Pulse: 72 71  Resp: 18 16  Temp: 97.8 F (36.6 C) 97.9 F (36.6 C)    Recent laboratory studies:  Lab Results  Component Value Date   HGB 11.2 (L) 01/16/2017   HGB 11.7 (L) 01/15/2017   HGB 15.1 12/31/2016   Lab Results  Component Value Date   WBC 6.4 01/16/2017   PLT 104 (L) 01/16/2017   Lab Results  Component Value Date   INR 1.00 12/31/2016   Lab Results  Component Value Date   NA 135 01/16/2017   K 3.5  01/16/2017   CL 102 01/16/2017   CO2 27 01/16/2017   BUN 22 (H) 01/16/2017   CREATININE 1.61 (H) 01/16/2017   GLUCOSE 192 (H) 01/16/2017    Discharge Medications:   Allergies as of 01/17/2017      Reactions   Gemfibrozil Other (See Comments)   Other reaction(s): MUSCLE PAIN Other reaction(s): MUSCLE PAIN   Niacin Other (See Comments)   Other reaction(s): MUSCLE PAIN Other reaction(s): MUSCLE PAIN   Nifedipine Other (See Comments)   Other reaction(s): MUSCLE PAIN Other reaction(s): MUSCLE PAIN   Simvastatin Other (See Comments)   Other reaction(s): MUSCLE PAIN Other reaction(s): MUSCLE PAIN      Medication List    STOP taking these medications   aspirin 81 MG tablet     TAKE these medications   amitriptyline 10 MG tablet Commonly known as:  ELAVIL Take 20 mg by mouth at bedtime. Takes 2 tablets   enoxaparin 30 MG/0.3ML injection Commonly known as:  LOVENOX Inject 0.3 mLs (30 mg total) into the skin every 12 (twelve) hours.   finasteride 5 MG tablet Commonly known as:  PROSCAR Take 1 tablet (5 mg total) by mouth daily.  linagliptin 5 MG Tabs tablet Commonly known as:  TRADJENTA Take 5 mg by mouth daily.   nitrofurantoin 50 MG capsule Commonly known as:  MACRODANTIN Take 50 mg by mouth daily.   oxyCODONE 5 MG immediate release tablet Commonly known as:  Oxy IR/ROXICODONE Take 1-2 tablets (5-10 mg total) by mouth every 4 (four) hours as needed for severe pain.   pantoprazole 40 MG tablet Commonly known as:  PROTONIX Take 1 tablet (40 mg total) by mouth daily.   polyethylene glycol packet Commonly known as:  MIRALAX / GLYCOLAX Take 17 g by mouth daily as needed for mild constipation.   rosuvastatin 5 MG tablet Commonly known as:  CRESTOR Take 5 mg by mouth See admin instructions. THREE TIMES WEEKLY, (Monday, Wednesday AND Friday)   senna 8.6 MG Tabs tablet Commonly known as:  SENOKOT Take 1 tablet by mouth daily as needed for mild constipation.    tamsulosin 0.4 MG Caps capsule Commonly known as:  FLOMAX Take 0.4 mg by mouth daily after breakfast.   traMADol 50 MG tablet Commonly known as:  ULTRAM Take 1-2 tablets (50-100 mg total) by mouth every 4 (four) hours as needed for moderate pain.            Durable Medical Equipment        Start     Ordered   01/14/17 1219  DME Walker rolling  Once    Question:  Patient needs a walker to treat with the following condition  Answer:  S/P total hip arthroplasty   01/14/17 1220   01/14/17 1219  DME Bedside commode  Once    Question:  Patient needs a bedside commode to treat with the following condition  Answer:  S/P total hip arthroplasty   01/14/17 1220      Diagnostic Studies: Dg Hip Port Unilat With Pelvis 1v Left  Result Date: 01/14/2017 CLINICAL DATA:  Status post left hip replacement EXAM: DG HIP (WITH OR WITHOUT PELVIS) 1V PORT LEFT COMPARISON:  None. FINDINGS: Left hip prosthesis is well seated and within normal limits. No acute bony or soft tissue abnormality is seen. Surgical drains are noted. Changes of prior prostatectomy are seen. IMPRESSION: Status post left hip replacement. Electronically Signed   By: Inez Catalina M.D.   On: 01/14/2017 11:14    Disposition: 01-Home or Self Care  Discharge Instructions    Diet - low sodium heart healthy    Complete by:  As directed    Diet - low sodium heart healthy    Complete by:  As directed    Increase activity slowly    Complete by:  As directed    Increase activity slowly    Complete by:  As directed        Contact information for follow-up providers    [name], MD On 02/26/2017.   Specialty:  Orthopedic Surgery Why:  at 10:45am Contact information: [address] [facility] [place] [address] [phone]            Contact information for after-discharge care    Destination    [facility] .   Specialty:  [place] Contact information: [place] [phone]                   Signed: Feliberto Gottron 01/17/2017, 8:45 AM

## 2017-01-15 NOTE — Progress Notes (Signed)
Physical Therapy Treatment Patient Details Name: Derek Guerra MRN: [id number] DOB: 03/23/1932 Today's Date: 01/15/2017    History of Present Illness Pt admitted for L THR.     PT Comments    Pt is making good progress towards goals with improved ambulation distance this session. Still needs heavy cues for sequencing and +2 for safety and equipment. Very eager to perform PT with good endurance with there-ex. Decreased pain noted this session. Will continue to progress.   Follow Up Recommendations  SNF     Equipment Recommendations  Rolling walker with 5" wheels    Recommendations for Other Services       Precautions / Restrictions Precautions Precautions: Fall;Posterior Hip Precaution Booklet Issued: Yes (comment) Precaution Comments: able to recal 1/3 precautions Restrictions Weight Bearing Restrictions: Yes LLE Weight Bearing: Weight bearing as tolerated    Mobility  Bed Mobility Overal bed mobility: Needs Assistance Bed Mobility: Sit to Supine       Sit to supine: Min assist   General bed mobility comments: assist for bringing B LEs and adjusting in bed.  Transfers Overall transfer level: Needs assistance Equipment used: Rolling walker (2 wheeled) Transfers: Sit to/from Stand Sit to Stand: Min assist;+2 physical assistance;+2 safety/equipment         General transfer comment: cues given for correct hand placement and pt able to stand with RW. Still with foward flexed posture, needs cues for knee extension and upright posture.  Ambulation/Gait Ambulation/Gait assistance: Min assist;+2 safety/equipment Ambulation Distance (Feet): 40 Feet Assistive device: Rolling walker (2 wheeled) Gait Pattern/deviations: Step-to pattern     General Gait Details: slow ambulation speed using RW with heavy cues for sequencing of RW. Also needs assist for advancement of RW and maintaining hip precautions. Slight fatigue with distance   Stairs            Wheelchair  Mobility    Modified Rankin (Stroke Patients Only)       Balance                                            Cognition Arousal/Alertness: Awake/alert Behavior During Therapy: WFL for tasks assessed/performed Overall Cognitive Status: Within Functional Limits for tasks assessed                                        Exercises Other Exercises Other Exercises: seated ther-ex performed on B LE including ankle pumps, quad sets, glut set, SAQ, LAQ, and hip abd/add. All ther-ex performed x 12 reps with min assist for L LE and supervision for R LE. Needs cues for correct technique.    General Comments        Pertinent Vitals/Pain Pain Assessment: 0-10 Pain Score: 4  Pain Location: L hip Pain Descriptors / Indicators: Aching;Grimacing;Operative site guarding Pain Intervention(s): Limited activity within patient's tolerance;Repositioned    Home Living                      Prior Function            PT Goals (current goals can now be found in the care plan section) Acute Rehab PT Goals Patient Stated Goal: to get stronger PT Goal Formulation: With patient Time For Goal Achievement: 01/28/17 Potential to Achieve Goals: Good Progress towards  PT goals: Progressing toward goals    Frequency    BID      PT Plan Current plan remains appropriate    Co-evaluation              AM-PAC PT "6 Clicks" Daily Activity  Outcome Measure  Difficulty turning over in bed (including adjusting bedclothes, sheets and blankets)?: Total Difficulty moving from lying on back to sitting on the side of the bed? : Total Difficulty sitting down on and standing up from a chair with arms (e.g., wheelchair, bedside commode, etc,.)?: Total Help needed moving to and from a bed to chair (including a wheelchair)?: A Little Help needed walking in hospital room?: A Little Help needed climbing 3-5 steps with a railing? : A Lot 6 Click Score: 11    End  of Session Equipment Utilized During Treatment: Gait belt Activity Tolerance: Patient limited by pain Patient left: in bed;with bed alarm set;with SCD's reapplied Nurse Communication: Mobility status PT Visit Diagnosis: Pain;Unsteadiness on feet (R26.81);Muscle weakness (generalized) (M62.81) Pain - Right/Left: Left Pain - part of body: Hip     Time: 7505-1833 PT Time Calculation (min) (ACUTE ONLY): 32 min  Charges:  $Gait Training: 8-22 mins $Therapeutic Exercise: 8-22 mins                    G Codes:       Derek Guerra, PT, DPT [phone]    Derek Guerra 01/15/2017, 4:31 PM

## 2017-01-15 NOTE — Progress Notes (Signed)
Pt. Alert and oriented. VSS. Pain controlled with PO pain meds. Neurochecks WDL. Foley d/c'd at 0500. Heels elevated off of bed with pillows. Ice pack in place. Pt. Dangled at the bedside and tolerated it well. IS encouraged. Resting quietly. Will continue to monitor.

## 2017-01-15 NOTE — Progress Notes (Signed)
Foley d/c'd at 0500. 

## 2017-01-15 NOTE — Progress Notes (Signed)
Physical Therapy Treatment Patient Details Name: Derek Guerra MRN: [id number] DOB: [birth year] Today's Date: 01/15/2017    History of Present Illness Pt admitted for L THR.     PT Comments    Pt is making good progress towards goals with increased mobility efforts this date. Needs +2 for assist and safety as pt with limited recall of post. Precautions. Slight flat affect this date, pt relates to pain. Good endurance with there-ex and pt eager to participate in therapy. Will continue to progress.   Follow Up Recommendations  SNF     Equipment Recommendations  Rolling walker with 5" wheels    Recommendations for Other Services       Precautions / Restrictions Precautions Precautions: Fall;Posterior Hip Precaution Booklet Issued: No Precaution Comments: able to recal 1/3 precautions Restrictions Weight Bearing Restrictions: Yes LLE Weight Bearing: Weight bearing as tolerated    Mobility  Bed Mobility Overal bed mobility: Needs Assistance Bed Mobility: Supine to Sit     Supine to sit: Min assist;+2 for safety/equipment Sit to supine: Mod assist   General bed mobility comments: needs assist for maintaining hip precautions. +2 for ease of transfer. Once seated at EOB, pt reports no dizziness, however increased pain noted  Transfers Overall transfer level: Needs assistance Equipment used: Rolling walker (2 wheeled) Transfers: Sit to/from Stand Sit to Stand: Mod assist;+2 physical assistance         General transfer comment: cues given for hand placement prior to standing. Slight buckling noted on L LE, however cued for knee extension. Also requires cues for posture.  Ambulation/Gait Ambulation/Gait assistance: Min assist;+2 physical assistance Ambulation Distance (Feet): 3 Feet Assistive device: Rolling walker (2 wheeled) Gait Pattern/deviations: Step-to pattern     General Gait Details: Slow ambulation with hesitant and fearful stepping. Heavy cues required for RW  assistance and maintaining hip precautions. Pt follows commands well. Fatigues quickly with limited ambulation   Stairs            Wheelchair Mobility    Modified Rankin (Stroke Patients Only)       Balance Overall balance assessment: Needs assistance Sitting-balance support: Feet supported Sitting balance-Leahy Scale: Good   Postural control: Posterior lean Standing balance support: Bilateral upper extremity supported Standing balance-Leahy Scale: Fair                              Cognition Arousal/Alertness: Awake/alert Behavior During Therapy: WFL for tasks assessed/performed Overall Cognitive Status: Within Functional Limits for tasks assessed                                 General Comments: Pt endorses STM difficulties at baseline from childhood, this appears to be impacting his ability to manage medications independently, as he reports he "sometimes forgets"      Exercises Other Exercises Other Exercises: supine ther-ex performed on B LE including ankle pumps, quad sets, glut set, SAQ, and hip abd/add. All ther-ex performed x 12 reps with min assist for L LE and supervision for R LE. Needs cues for correct technique.    General Comments        Pertinent Vitals/Pain Pain Assessment: 0-10 Pain Score: 6  Pain Location: L hip Pain Descriptors / Indicators: Aching;Grimacing;Operative site guarding Pain Intervention(s): Limited activity within patient's tolerance;Ice applied;Repositioned    Home Living Family/patient expects to be discharged to:: Private residence Living Arrangements: Alone (  spouse, Rod Holler, passed away in January 2018) Available Help at Discharge:  (pt reports cousin's husband is in town for a short period visiting) Type of Home: Independent living facility [facility]) Home Access: Level entry   Home Layout: One level Home Equipment: Walker - 4 wheels;Grab bars - toilet;Grab bars - tub/shower Additional Comments:  pt reports having Nu Step    Prior Function Level of Independence: Independent with assistive device(s)      Comments: Uses Rollator to ambulate.  Denies any falls over the past 12 months.  Is independent with ADL and IADL including driving, meal prep. Household ambulator. Assist w/ cleaning 2x/mo; endorses "easily forgets" medications due to baseline STM difficulties; pt enjoys composing music and plays the piano, uses Nu Step 2x/day for 15 mins   PT Goals (current goals can now be found in the care plan section) Acute Rehab PT Goals Patient Stated Goal: to get stronger PT Goal Formulation: With patient Time For Goal Achievement: 01/28/17 Potential to Achieve Goals: Good Progress towards PT goals: Progressing toward goals    Frequency    BID      PT Plan Current plan remains appropriate    Co-evaluation              AM-PAC PT "6 Clicks" Daily Activity  Outcome Measure  Difficulty turning over in bed (including adjusting bedclothes, sheets and blankets)?: Total Difficulty moving from lying on back to sitting on the side of the bed? : Total Difficulty sitting down on and standing up from a chair with arms (e.g., wheelchair, bedside commode, etc,.)?: Total Help needed moving to and from a bed to chair (including a wheelchair)?: A Lot Help needed walking in hospital room?: A Lot Help needed climbing 3-5 steps with a railing? : Total 6 Click Score: 8    End of Session Equipment Utilized During Treatment: Gait belt Activity Tolerance: Patient limited by pain Patient left: in chair;with chair alarm set;with SCD's reapplied Nurse Communication: Mobility status PT Visit Diagnosis: Pain;Unsteadiness on feet (R26.81);Muscle weakness (generalized) (M62.81) Pain - Right/Left: Left Pain - part of body: Hip     Time: 6389-3734 PT Time Calculation (min) (ACUTE ONLY): 23 min  Charges:  $Gait Training: 8-22 mins $Therapeutic Exercise: 8-22 mins                    G Codes:        Derek Guerra, PT, DPT [phone]    Derek Guerra 01/15/2017, 11:13 AM

## 2017-01-15 NOTE — Anesthesia Postprocedure Evaluation (Signed)
Anesthesia Post Note  Patient: Derek Guerra  Procedure(s) Performed: Procedure(s) (LRB): TOTAL HIP ARTHROPLASTY (Left)  Patient location during evaluation: Nursing Unit Anesthesia Type: Spinal Level of consciousness: awake, awake and alert and oriented Pain management: pain level controlled Vital Signs Assessment: post-procedure vital signs reviewed and stable Respiratory status: spontaneous breathing, nonlabored ventilation and respiratory function stable Cardiovascular status: blood pressure returned to baseline and stable Postop Assessment: no headache, no backache, patient able to bend at knees, no signs of nausea or vomiting and adequate PO intake Anesthetic complications: no     Last Vitals:  Vitals:   01/14/17 2346 01/15/17 0431  BP: (!) 168/58 (!) 142/68  Pulse: 70 63  Resp: 18 16  Temp: 37.1 C 36.8 C    Last Pain:  Vitals:   01/15/17 0610  TempSrc:   PainSc: 4                  [employer]

## 2017-01-16 LAB — BASIC METABOLIC PANEL
Anion gap: 6 (ref 5–15)
BUN: 22 mg/dL — AB (ref 6–20)
CHLORIDE: 102 mmol/L (ref 101–111)
CO2: 27 mmol/L (ref 22–32)
Calcium: 8.4 mg/dL — ABNORMAL LOW (ref 8.9–10.3)
Creatinine, Ser: 1.61 mg/dL — ABNORMAL HIGH (ref 0.61–1.24)
GFR calc Af Amer: 44 mL/min — ABNORMAL LOW (ref >60)
GFR calc non Af Amer: 38 mL/min — ABNORMAL LOW (ref >60)
GLUCOSE: 192 mg/dL — AB (ref 65–99)
POTASSIUM: 3.5 mmol/L (ref 3.5–5.1)
Sodium: 135 mmol/L (ref 135–145)

## 2017-01-16 LAB — CBC
HCT: 32.3 % — ABNORMAL LOW (ref 40.0–52.0)
HEMOGLOBIN: 11.2 g/dL — AB (ref 13.0–18.0)
MCH: 33.2 pg (ref 26.0–34.0)
MCHC: 34.8 g/dL (ref 32.0–36.0)
MCV: 95.2 fL (ref 80.0–100.0)
Platelets: 104 10*3/uL — ABNORMAL LOW (ref 150–440)
RBC: 3.39 MIL/uL — ABNORMAL LOW (ref 4.40–5.90)
RDW: 13.1 % (ref 11.5–14.5)
WBC: 6.4 10*3/uL (ref 3.8–10.6)

## 2017-01-16 LAB — GLUCOSE, CAPILLARY
GLUCOSE-CAPILLARY: 191 mg/dL — AB (ref 65–99)
Glucose-Capillary: 157 mg/dL — ABNORMAL HIGH (ref 65–99)
Glucose-Capillary: 161 mg/dL — ABNORMAL HIGH (ref 65–99)
Glucose-Capillary: 181 mg/dL — ABNORMAL HIGH (ref 65–99)

## 2017-01-16 LAB — URINALYSIS, COMPLETE (UACMP) WITH MICROSCOPIC
BILIRUBIN URINE: NEGATIVE
Bacteria, UA: NONE SEEN
GLUCOSE, UA: 150 mg/dL — AB
Hgb urine dipstick: NEGATIVE
KETONES UR: NEGATIVE mg/dL
LEUKOCYTES UA: NEGATIVE
Nitrite: NEGATIVE
PH: 5 (ref 5.0–8.0)
Protein, ur: NEGATIVE mg/dL
RBC / HPF: NONE SEEN RBC/hpf (ref 0–5)
Specific Gravity, Urine: 1.01 (ref 1.005–1.030)
Squamous Epithelial / LPF: NONE SEEN

## 2017-01-16 LAB — SURGICAL PATHOLOGY

## 2017-01-16 MED ORDER — ENOXAPARIN SODIUM 30 MG/0.3ML ~~LOC~~ SOLN: 30.0000 mg | Freq: Two times a day (BID) | SUBCUTANEOUS | 0 refills | Status: AC

## 2017-01-16 MED ORDER — OXYCODONE HCL 5 MG PO TABS: 5.0000 mg | ORAL_TABLET | ORAL | 0 refills | Status: AC | PRN

## 2017-01-16 MED ORDER — PHENAZOPYRIDINE HCL 100 MG PO TABS: 100.0000 mg | ORAL_TABLET | Freq: Three times a day (TID) | ORAL | Status: DC

## 2017-01-16 MED ORDER — LACTULOSE 10 GM/15ML PO SOLN
10.0000 g | Freq: Two times a day (BID) | ORAL | Status: DC | PRN
  Administered 2017-01-16: 10 g via ORAL
  Filled 2017-01-16: qty 30

## 2017-01-16 MED ORDER — TRAMADOL HCL 50 MG PO TABS: 50.0000 mg | ORAL_TABLET | ORAL | 0 refills | Status: AC | PRN

## 2017-01-16 NOTE — Clinical Social Work Note (Signed)
Clinical Social Work Assessment  Patient Details  Name: Derek Guerra MRN: [id number] Date of Birth: [birth year]  Date of referral:  01/16/17               Reason for consult:  Facility Placement, Discharge Planning                Permission sought to share information with:  Family Supports Permission granted to share information::  Yes, Verbal Permission Granted  Name::     Rush Landmark and Renata Caprice  Relationship::  POAs  Contact Information:  [phone]  Housing/Transportation Living arrangements for the past 2 months:  [place] of Information:  Patient, Power of Attorney Patient Interpreter Needed:  None Criminal Activity/Legal Involvement Pertinent to Current Situation/Hospitalization:  No - Comment as needed Significant Relationships:  Other Family Members Lives with:  Self Do you feel safe going back to the place where you live?  No (Pt is in need of a higher level of care) Need for family participation in patient care:  Yes (Comment)  Care giving concerns:  No care giving concerns identified.    Social Worker assessment / plan:  CSW met with pt and POA to address consult for New SNF. CSW introduced herself and explained role of social work. CSW also explained the process of discharging to SNF. Pt lives at [facility] and will have a bed in Healthcare. Pt will likely discharge tomorrow. CSW will update PA. Facility will be able to accept pt at discharge. CSW will continue to follow.    Employment status:  Retired Forensic scientist:  Medicare PT Recommendations:  [place] / Referral to community resources:  [place]  Patient/Family's Response to care:  Pt and POA were appreciative of CSW support.   Patient/Family's Understanding of and Emotional Response to Diagnosis, Current Treatment, and Prognosis:  Pt understands that he is in need of STR at SNF prior to returning home.   Emotional  Assessment Appearance:  Appears stated age Attitude/Demeanor/Rapport:   (Appropriate) Affect (typically observed):  Accepting, Adaptable, Pleasant Orientation:  Oriented to Self, Oriented to Place, Oriented to  Time, Oriented to Situation Alcohol / Substance use:  Not Applicable Psych involvement (Current and /or in the community):  No (Comment)  Discharge Needs  Concerns to be addressed:  Adjustment to Illness Readmission within the last 30 days:  No Current discharge risk:  None Barriers to Discharge:  Continued Medical Work up   [employer], LCSW 01/16/2017, 11:56 AM

## 2017-01-16 NOTE — Care Management Important Message (Signed)
Important Message  Patient Details  Name: Derek Guerra MRN: [id number] Date of Birth: [birth year]   Medicare Important Message Given:  Yes    Jolly Mango, RN 01/16/2017, 9:45 AM

## 2017-01-16 NOTE — Progress Notes (Signed)
Physical Therapy Treatment Patient Details Name: Derek Guerra MRN: [id number] DOB: 01-Aug-1932 Today's Date: 01/16/2017    History of Present Illness Pt. was admitted to [facility] for a Left THR.    PT Comments    Pt is making good progress towards goals with improved endurance with there-ex. Pt still only able to recite 1/3 hip precautions and encouraged to review the written HEP. Buckling noted in gait training during reciprocal stepping attempts. Pt fatigued this afternoon and frustrated with performance. Will continue to progress as able.   Follow Up Recommendations  SNF     Equipment Recommendations  Rolling walker with 5" wheels    Recommendations for Other Services       Precautions / Restrictions Precautions Precautions: Posterior Hip;Fall Precaution Booklet Issued: Yes (comment) Precaution Comments: able to recall 1/3 precautions Restrictions Weight Bearing Restrictions: Yes LLE Weight Bearing: Weight bearing as tolerated    Mobility  Bed Mobility Overal bed mobility: Needs Assistance Bed Mobility: Sit to Supine       Sit to supine: Min assist   General bed mobility comments: safe technique for sitting on EOB and following commands for returning back to bed. Assist required for bringing L LE up on bed.  Transfers Overall transfer level: Needs assistance Equipment used: Rolling walker (2 wheeled) Transfers: Sit to/from Stand Sit to Stand: Min assist         General transfer comment: cues given for safety and correct technique. Once standing, slight post leaning, however able to self correct with cues. Once upright, needs cues for posture.  Ambulation/Gait Ambulation/Gait assistance: Min assist Ambulation Distance (Feet): 80 Feet Assistive device: Rolling walker (2 wheeled) Gait Pattern/deviations: Step-to pattern     General Gait Details: ambulated with slow step to gait pattern. Attempted reciprocal gait, however L knee buckling noted. Needs min assist  for correction. Returned to step to gait. Pt fatigues with exertion. Needs heavy cues for maintain hip precautions as he tends to let L foot IR past neutral.   Stairs            Wheelchair Mobility    Modified Rankin (Stroke Patients Only)       Balance                                            Cognition Arousal/Alertness: Awake/alert Behavior During Therapy: WFL for tasks assessed/performed Overall Cognitive Status: Within Functional Limits for tasks assessed                                        Exercises Other Exercises Other Exercises: seated ther-ex performed on B LE including ankle pumps, quad sets, glut set, SAQ, LAQ, and hip abd/add. All ther-ex performed x 15 reps with min assist for L LE and supervision for R LE. Needs cues for correct technique. Other Exercises: gave urinal for assistance for bathroom tasks. Able to perform in seated position    General Comments        Pertinent Vitals/Pain Pain Assessment: 0-10 Pain Score: 5  Pain Location: Left Hip Pain Descriptors / Indicators: Aching Pain Intervention(s): Limited activity within patient's tolerance    Home Living                      Prior Function  PT Goals (current goals can now be found in the care plan section) Acute Rehab PT Goals Patient Stated Goal: To return home PT Goal Formulation: With patient Time For Goal Achievement: 01/28/17 Potential to Achieve Goals: Good Progress towards PT goals: Progressing toward goals    Frequency    BID      PT Plan Current plan remains appropriate    Co-evaluation              AM-PAC PT "6 Clicks" Daily Activity  Outcome Measure  Difficulty turning over in bed (including adjusting bedclothes, sheets and blankets)?: Total Difficulty moving from lying on back to sitting on the side of the bed? : Total Difficulty sitting down on and standing up from a chair with arms (e.g.,  wheelchair, bedside commode, etc,.)?: Total Help needed moving to and from a bed to chair (including a wheelchair)?: A Little Help needed walking in hospital room?: A Little Help needed climbing 3-5 steps with a railing? : A Lot 6 Click Score: 11    End of Session Equipment Utilized During Treatment: Gait belt Activity Tolerance: Patient tolerated treatment well Patient left: in bed;with bed alarm set Nurse Communication: Mobility status PT Visit Diagnosis: Pain;Unsteadiness on feet (R26.81);Muscle weakness (generalized) (M62.81) Pain - Right/Left: Left Pain - part of body: Hip     Time: 1351-1420 PT Time Calculation (min) (ACUTE ONLY): 29 min  Charges:  $Gait Training: 8-22 mins $Therapeutic Exercise: 8-22 mins                    G Codes:       Greggory Stallion, PT, DPT [phone]    Krisanne Lich 01/16/2017, 3:30 PM

## 2017-01-16 NOTE — Clinical Social Work Placement (Signed)
   CLINICAL SOCIAL WORK PLACEMENT  NOTE  Date:  01/16/2017  Patient Details  Name: Derek Guerra MRN: [id number] Date of Birth: [birth year]  Clinical Social Work is seeking post-discharge placement for this patient at the [place] level of care (*CSW will initial, date and re-position this form in  chart as items are completed):  Yes   Patient/family provided with [place] Work Department's list of facilities offering this level of care within the geographic area requested by the patient (or if unable, by the patient's family).  Yes   Patient/family informed of their freedom to choose among providers that offer the needed level of care, that participate in Medicare, Medicaid or managed care program needed by the patient, have an available bed and are willing to accept the patient.  Yes   Patient/family informed of [place]'s ownership interest in [facility] and [facility], as well as of the fact that they are under no obligation to receive care at these facilities.  PASRR submitted to EDS on 01/14/17     PASRR number received on 01/14/17     Existing PASRR number confirmed on       FL2 transmitted to all facilities in geographic area requested by pt/family on 01/14/17     FL2 transmitted to all facilities within larger geographic area on       Patient informed that his/her managed care company has contracts with or will negotiate with certain facilities, including the following:        Yes   Patient/family informed of bed offers received.  Patient chooses bed at [facility]     Physician recommends and patient chooses bed at      Patient to be transferred to   on  .  Patient to be transferred to facility by       Patient family notified on   of transfer.  Name of family member notified:        PHYSICIAN       Additional Comment:    _______________________________________________ Darden Dates, LCSW 01/16/2017, 11:54  AM

## 2017-01-16 NOTE — Plan of Care (Signed)
Problem: Activity: Goal: Ability to avoid complications of mobility impairment will improve Outcome: Progressing Patient up to chair with PT. Tolerating activity well. Moderate pain with movement. RN has reminded patient of hip precautions.  Deri Fuelling, RN

## 2017-01-16 NOTE — Progress Notes (Signed)
Occupational Therapy Treatment Patient Details Name: Derek Guerra MRN: [id number] DOB: Sep 08, 1932 Today's Date: 01/16/2017    History of present illness Pt. Is an 83 y.o. Male who was admitted to [facility] for a Left THR.   OT comments  Pt. continues to present with weakness, pain, hip precautions, and limited mobility which hinder ADL performance. Pt. requires extensive assist for LE ADLs with verbal, and tactile cues for A/E use. Pt. requires increased verbal cues  and increased assist transitioning from stand to sit with max cues for hand placement and motor planning. Pt. Continues to be appropriate for SNF with follow-up OT services.   Follow Up Recommendations  SNF    Equipment Recommendations       Recommendations for Other Services      Precautions / Restrictions Precautions Precautions: Posterior Hip;Fall Precaution Booklet Issued: Yes (comment) Precaution Comments: able to recall 1/3 precautions Restrictions Weight Bearing Restrictions: Yes LLE Weight Bearing: Weight bearing as tolerated                                                     ADL either performed or assessed with clinical judgement   ADL Overall ADL's : Needs assistance/impaired Eating/Feeding: Set up   Grooming: Set up   Upper Body Bathing: Minimal assistance   Lower Body Bathing: Maximal assistance   Upper Body Dressing : Minimal assistance   Lower Body Dressing: Maximal assistance               Functional mobility during ADLs: Moderate assistance (With max cues for hand placement when transitioning to sit, with increased time.) General ADL Comments: Pt. education was provided about A/E use for LE ADLs.     Vision   Eye Alignment: Within Functional Limits   Perception     Praxis      Cognition Arousal/Alertness: Awake/alert Behavior During Therapy: WFL for tasks assessed/performed Overall Cognitive Status: Within Functional Limits for tasks assessed                                          Exercises   Shoulder Instructions   General Comments      Pertinent Vitals/ Pain       Pain Assessment: 0-10 Pain Score: 6  Pain Location: Left Hip Pain Descriptors / Indicators: Aching Pain Intervention(s): Limited activity within patient's tolerance;Monitored during session  Home Living                                          Prior Functioning/Environment              Frequency  Min 2X/week        Progress Toward Goals  OT Goals(current goals can now be found in the care plan section)     Acute Rehab OT Goals Patient Stated Goal: To return home OT Goal Formulation: With patient Potential to Achieve Goals: Good  Plan      Co-evaluation                 AM-PAC PT "6 Clicks" Daily Activity     Outcome Measure     Help  from another person taking care of personal grooming?: None Help from another person toileting, which includes using toliet, bedpan, or urinal?: A Lot Help from another person bathing (including washing, rinsing, drying)?: A Lot Help from another person to put on and taking off regular upper body clothing?: A Little Help from another person to put on and taking off regular lower body clothing?: A Lot 6 Click Score: 13    End of Session Equipment Utilized During Treatment: Gait belt  OT Visit Diagnosis: History of falling (Z91.81) Pain - Right/Left: Left Pain - part of body: Hip   Activity Tolerance Patient tolerated treatment well   Patient Left in chair;with call bell/phone within reach;with chair alarm set   Nurse Communication          Time: 1010-1033 OT Time Calculation (min): 23 min  Charges: OT General Charges $OT Visit: 1 Procedure OT Treatments $Self Care/Home Management : 23-37 mins  Harrel Carina, MS, OTR/L    Harrel Carina, MS, OTR/L 01/16/2017, 12:38 PM

## 2017-01-16 NOTE — Progress Notes (Signed)
   Subjective: 2 Days Post-Op Procedure(s) (LRB): TOTAL HIP ARTHROPLASTY (Left) Patient reports pain as moderate.   Patient is well, and has had no acute complaints or problems Patient did fair with therapy yesterday  Plan is to go Rehab after hospital stay. no nausea and no vomiting Patient denies any chest pains or shortness of breath. Patient now complaining of the inability to hold his urine. Does have a little discomfort with this. Taking Flomax. There was no resistance noted with insertion of the catheter at the time of surgery. No blood was present in the catheter at the time of insertion.  Objective: Vital signs in last 24 hours: Temp:  [97.6 F (36.4 C)-98.2 F (36.8 C)] 97.6 F (36.4 C) (05/10 2321) Pulse Rate:  [57-76] 76 (05/10 2321) Resp:  [16-18] 18 (05/10 2321) BP: (118-150)/(62-81) 118/70 (05/10 2321) SpO2:  [95 %-97 %] 95 % (05/10 2321) well approximated incision Heels are non tender and elevated off the bed using rolled towels Intake/Output from previous day: 05/10 0701 - 05/11 0700 In: 1280 [P.O.:480; I.V.:800] Out: 1336 [Urine:1051; Drains:285] Intake/Output this shift: No intake/output data recorded.   Recent Labs  01/15/17 0520 01/16/17 0400  HGB 11.7* 11.2*    Recent Labs  01/15/17 0520 01/16/17 0400  WBC 6.4 6.4  RBC 3.49* 3.39*  HCT 33.5* 32.3*  PLT 118* 104*    Recent Labs  01/15/17 0520 01/16/17 0400  NA 137 135  K 3.5 3.5  CL 106 102  CO2 27 27  BUN 22* 22*  CREATININE 1.39* 1.61*  GLUCOSE 191* 192*  CALCIUM 8.3* 8.4*   No results for input(s): LABPT, INR in the last 72 hours.  EXAM General - Patient is Alert, Appropriate and Oriented Extremity - Neurologically intact Neurovascular intact Sensation intact distally Intact pulses distally Dorsiflexion/Plantar flexion intact No cellulitis present Compartment soft Dressing - dressing C/D/I Motor Function - intact, moving foot and toes well on exam.    Past Medical  History:  Diagnosis Date  . Chronic kidney disease   . Diabetes mellitus without complication ([place])   . Prostate cancer ([place])   . Urinary incontinence     Assessment/Plan: 2 Days Post-Op Procedure(s) (LRB): TOTAL HIP ARTHROPLASTY (Left) Active Problems:   Status post total replacement of hip  Estimated body mass index is 20.37 kg/m as calculated from the following:   Height as of this encounter: 5\' 10"  (1.778 m).   Weight as of this encounter: 64.4 kg (142 lb). Up with therapy Plan for discharge tomorrow Discharge to SNF  Labs: Were reviewed DVT Prophylaxis - Lovenox, Foot Pumps and TED hose Weight-Bearing as tolerated to left leg Hemovac was discontinued on today's visit. We'll get urinalysis Started on Pyridium  Derek Guerra. [place] 01/16/2017, 7:10 AM

## 2017-01-16 NOTE — Progress Notes (Signed)
Physical Therapy Treatment Patient Details Name: Derek Guerra MRN: [id number] DOB: 03/04/1932 Today's Date: 01/16/2017    History of Present Illness Pt admitted for L THR.     PT Comments    Pt is making good progress towards goals with increased gait distance performed this date with RW use. Pt still requires heavy cues for sequencing and chair follow for safety although no seated rest breaks noted. Good endurance with there-ex. Pt very quiet this date, reports he is in pain, although rates at 5/10 and agreeable to participate in therapy. Will plan to progress in PM.   Follow Up Recommendations  SNF     Equipment Recommendations  Rolling walker with 5" wheels    Recommendations for Other Services       Precautions / Restrictions Precautions Precautions: Fall;Posterior Hip Precaution Booklet Issued: Yes (comment) Precaution Comments: able to recal 1/3 precautions Restrictions Weight Bearing Restrictions: Yes LLE Weight Bearing: Weight bearing as tolerated    Mobility  Bed Mobility Overal bed mobility: Needs Assistance Bed Mobility: Supine to Sit     Supine to sit: Min assist     General bed mobility comments: Improved technique this session with pt able to perform most of transfer with cues. Assist required for sliding B LEs off bed.  Transfers Overall transfer level: Needs assistance Equipment used: Rolling walker (2 wheeled) Transfers: Sit to/from Stand Sit to Stand: Min assist;+2 safety/equipment         General transfer comment: cues given for sequencing with limited carry over noted. Once standing, upright posture noted using RW with cues for maintence.  Ambulation/Gait Ambulation/Gait assistance: Min assist;+2 safety/equipment Ambulation Distance (Feet): 80 Feet Assistive device: Rolling walker (2 wheeled) Gait Pattern/deviations: Step-to pattern     General Gait Details: slow step to gait pattern able to progress to inconsistent reciprocal pattern.  Safe technique performed, however pt fatigues quickly and needs cues to maintain hip precautions especially with L turns.   Stairs            Wheelchair Mobility    Modified Rankin (Stroke Patients Only)       Balance                                            Cognition Arousal/Alertness: Awake/alert Behavior During Therapy: WFL for tasks assessed/performed Overall Cognitive Status: Within Functional Limits for tasks assessed                                        Exercises Other Exercises Other Exercises: supine ther-ex performed on B LE including ankle pumps, quad sets, glut set, SAQ, and hip abd/add. All ther-ex performed x 15 reps with min assist for L LE and supervision for R LE. Needs cues for correct technique.    General Comments        Pertinent Vitals/Pain Pain Assessment: 0-10 Pain Score: 5  Pain Location: L hip Pain Descriptors / Indicators: Aching;Grimacing;Operative site guarding Pain Intervention(s): Limited activity within patient's tolerance;RN gave pain meds during session    Home Living                      Prior Function            PT Goals (current goals can now be  found in the care plan section) Acute Rehab PT Goals Patient Stated Goal: to get stronger PT Goal Formulation: With patient Time For Goal Achievement: 01/28/17 Potential to Achieve Goals: Good Progress towards PT goals: Progressing toward goals    Frequency    BID      PT Plan Current plan remains appropriate    Co-evaluation              AM-PAC PT "6 Clicks" Daily Activity  Outcome Measure  Difficulty turning over in bed (including adjusting bedclothes, sheets and blankets)?: Total Difficulty moving from lying on back to sitting on the side of the bed? : Total Difficulty sitting down on and standing up from a chair with arms (e.g., wheelchair, bedside commode, etc,.)?: Total Help needed moving to and from a bed  to chair (including a wheelchair)?: A Little Help needed walking in hospital room?: A Little Help needed climbing 3-5 steps with a railing? : A Lot 6 Click Score: 11    End of Session Equipment Utilized During Treatment: Gait belt Activity Tolerance: Patient tolerated treatment well Patient left: in chair (OT entered room; agreed to connect alarm) Nurse Communication: Mobility status PT Visit Diagnosis: Pain;Unsteadiness on feet (R26.81);Muscle weakness (generalized) (M62.81) Pain - Right/Left: Left Pain - part of body: Hip     Time: 9485-4627 PT Time Calculation (min) (ACUTE ONLY): 29 min  Charges:  $Gait Training: 8-22 mins $Therapeutic Exercise: 8-22 mins                    G Codes:       Greggory Stallion, PT, DPT [phone]    Derek Guerra 01/16/2017, 10:36 AM

## 2017-01-17 LAB — GLUCOSE, CAPILLARY
GLUCOSE-CAPILLARY: 281 mg/dL — AB (ref 65–99)
Glucose-Capillary: 188 mg/dL — ABNORMAL HIGH (ref 65–99)

## 2017-01-17 NOTE — Progress Notes (Signed)
Physical Therapy Treatment Patient Details Name: Derek Guerra MRN: [id number] DOB: 1931-09-09 Today's Date: 01/17/2017    History of Present Illness Pt. was admitted to [facility] for a Left THR.    PT Comments    In bed, ready for session.  Participated in exercises as described below.  To edge of bed with min assist.  He was able to stand and ambulate 120' then 40' with a seated rest break.  Gait with overall poor quality, unable to complete step through gait at this time safely.  He takes small step to advance on LLE then does not fully advance RLE.  While pt had good motivation to ambulate around nursing unit today, he was unable to fully complete.  As he fatigued, gait quality diminished and he was given a ride back to his room for safety.   Follow Up Recommendations  SNF     Equipment Recommendations       Recommendations for Other Services       Precautions / Restrictions Precautions Precautions: Posterior Hip;Fall Precaution Booklet Issued: Yes (comment) Precaution Comments: needs assist to recall hip precautions Restrictions Weight Bearing Restrictions: Yes LLE Weight Bearing: Weight bearing as tolerated    Mobility  Bed Mobility Overal bed mobility: Needs Assistance Bed Mobility: Sit to Supine     Supine to sit: Min assist     General bed mobility comments: for LE and light assit for trunk  Transfers Overall transfer level: Needs assistance Equipment used: Rolling walker (2 wheeled) Transfers: Sit to/from Stand Sit to Stand: Min assist         General transfer comment: poor hand placements requiring verbal and tactile cues  Ambulation/Gait Ambulation/Gait assistance: Min assist Ambulation Distance (Feet): 120 Feet Assistive device: Rolling walker (2 wheeled) Gait Pattern/deviations: Decreased step length - right;Decreased step length - left   Gait velocity interpretation: <1.8 ft/sec, indicative of risk for recurrent falls General Gait Details: 120'  then an additiona 40' slow gait with irregular step length, unable to ambulate with step through pattern, decreased stance time left   Stairs            Wheelchair Mobility    Modified Rankin (Stroke Patients Only)       Balance Overall balance assessment: Needs assistance Sitting-balance support: Feet supported Sitting balance-Leahy Scale: Good     Standing balance support: Bilateral upper extremity supported Standing balance-Leahy Scale: Fair                              Cognition Arousal/Alertness: Awake/alert Behavior During Therapy: WFL for tasks assessed/performed Overall Cognitive Status: Within Functional Limits for tasks assessed                                        Exercises Other Exercises Other Exercises: supine x 10 A/AAROM for ankle pumps, heel slides, quad sets, ab/add and SAQ    General Comments        Pertinent Vitals/Pain Pain Assessment: 0-10 Pain Score: 4  Pain Location: Left Hip Pain Descriptors / Indicators: Sore Pain Intervention(s): Limited activity within patient's tolerance;Patient requesting pain meds-RN notified    Home Living                      Prior Function            PT Goals (  current goals can now be found in the care plan section) Progress towards PT goals: Progressing toward goals    Frequency    BID      PT Plan Current plan remains appropriate    Co-evaluation              AM-PAC PT "6 Clicks" Daily Activity  Outcome Measure  Difficulty turning over in bed (including adjusting bedclothes, sheets and blankets)?: Total Difficulty moving from lying on back to sitting on the side of the bed? : Total Difficulty sitting down on and standing up from a chair with arms (e.g., wheelchair, bedside commode, etc,.)?: Total Help needed moving to and from a bed to chair (including a wheelchair)?: A Little Help needed walking in hospital room?: A Little Help needed  climbing 3-5 steps with a railing? : A Lot 6 Click Score: 11    End of Session Equipment Utilized During Treatment: Gait belt Activity Tolerance: Patient tolerated treatment well;Patient limited by fatigue Patient left: in chair;with chair alarm set;with call bell/phone within reach   Pain - Right/Left: Left Pain - part of body: Hip     Time: 1017-5102 PT Time Calculation (min) (ACUTE ONLY): 24 min  Charges:  $Gait Training: 8-22 mins $Therapeutic Exercise: 8-22 mins                    G Codes:       Chesley Noon, PTA 01/17/17, 10:03 AM

## 2017-01-17 NOTE — Clinical Social Work Note (Signed)
The patient will discharge to [facility] for STR today via non-emergent EMS and pending a bowel movement. The facility and patient are aware. CSW left a voice mail for the POAs to update. CSW will continue to follow pending additional discharge needs.  Santiago Bumpers, MSW, Latanya Presser [phone]

## 2017-01-17 NOTE — Progress Notes (Addendum)
   Subjective: 3 Days Post-Op Procedure(s) (LRB): TOTAL HIP ARTHROPLASTY (Left) Patient reports pain as mild.   Patient is well, and has had no acute complaints or problems.  Patient's urination is close to baseline, denies any increasing pain with urination. Urinalysis negative for infection. Patient tolerating physical therapy well today. Plan is to go Rehab today pending bowel movement Patient denies any nausea, vomiting, chest pain, shortness of breath.  Objective: Vital signs in last 24 hours: Temp:  [97.8 F (36.6 C)-98.1 F (36.7 C)] 97.9 F (36.6 C) (05/12 0740) Pulse Rate:  [68-72] 71 (05/12 0740) Resp:  [16-18] 16 (05/12 0740) BP: (118-150)/(57-72) 150/72 (05/12 0740) SpO2:  [91 %-94 %] 93 % (05/12 0740)  Intake/Output from previous day: 05/11 0701 - 05/12 0700 In: 0  Out: 1175 [Urine:1175] Intake/Output this shift: No intake/output data recorded.   Recent Labs  01/15/17 0520 01/16/17 0400  HGB 11.7* 11.2*    Recent Labs  01/15/17 0520 01/16/17 0400  WBC 6.4 6.4  RBC 3.49* 3.39*  HCT 33.5* 32.3*  PLT 118* 104*    Recent Labs  01/15/17 0520 01/16/17 0400  NA 137 135  K 3.5 3.5  CL 106 102  CO2 27 27  BUN 22* 22*  CREATININE 1.39* 1.61*  GLUCOSE 191* 192*  CALCIUM 8.3* 8.4*   No results for input(s): LABPT, INR in the last 72 hours.  EXAM General - Patient is Alert, Appropriate and Oriented Extremity - Neurologically intact Neurovascular intact Sensation intact distally Intact pulses distally Dorsiflexion/Plantar flexion intact No cellulitis present Compartment soft Dressing - dressing C/D/I and scant drainage Motor Function - intact, moving foot and toes well on exam.    Past Medical History:  Diagnosis Date  . Chronic kidney disease   . Diabetes mellitus without complication ([place])   . Prostate cancer ([place])   . Urinary incontinence     Assessment/Plan: 3 Days Post-Op Procedure(s) (LRB): TOTAL HIP ARTHROPLASTY (Left) Active  Problems:   Status post total replacement of hip  Estimated body mass index is 20.37 kg/m as calculated from the following:   Height as of this encounter: 5\' 10"  (1.778 m).   Weight as of this encounter: 64.4 kg (142 lb). Up with therapy Discharge to SNF today pending bowel movement Hemoglobin stable Will change dressing prior to discharge Follow-up with [facility] clinic orthopedics in 6 weeks Staples to be removed on 01/28/2017, Steri-Strips to be applied TED hose daily for 6 weeks, remove at nighttime.   Labs: Were reviewed DVT Prophylaxis - Lovenox, Foot Pumps and TED hose Weight-Bearing as tolerated to left leg   Duanne Guess PA-C [place] 01/17/2017, 8:49 AM

## 2017-01-17 NOTE — Care Management Note (Signed)
Case Management Note  Patient Details  Name: Derek Guerra MRN: [id number] Date of Birth: April 03, 1932  Subjective/Objective:    Per CSW note from yesterday, Mr Lukins will be discharged to [facility] today. He was a resident at [facility] but is now being discharged to [facility].                 Action/Plan:   Expected Discharge Date:  01/17/17               Expected Discharge Plan:     In-House Referral:     Discharge planning Services  CM Consult  Post Acute Care Choice:    Choice offered to:     DME Arranged:    DME Agency:     HH Arranged:    HH Agency:     Status of Service:  In process, will continue to follow  If discussed at Long Length of Stay Meetings, dates discussed:    Additional Comments:  Lem Peary A, RN 01/17/2017, 8:56 AM

## 2017-01-20 DIAGNOSIS — N400 Enlarged prostate without lower urinary tract symptoms: Secondary | ICD-10-CM | POA: Diagnosis not present

## 2017-01-20 DIAGNOSIS — I10 Essential (primary) hypertension: Secondary | ICD-10-CM

## 2017-01-20 DIAGNOSIS — E119 Type 2 diabetes mellitus without complications: Secondary | ICD-10-CM | POA: Diagnosis not present

## 2017-01-20 DIAGNOSIS — M1632 Unilateral osteoarthritis resulting from hip dysplasia, left hip: Secondary | ICD-10-CM | POA: Diagnosis not present

## 2017-04-13 ENCOUNTER — Encounter: Payer: Self-pay | Admitting: Podiatry

## 2017-04-13 ENCOUNTER — Ambulatory Visit (INDEPENDENT_AMBULATORY_CARE_PROVIDER_SITE_OTHER): Payer: Medicare Other | Admitting: Podiatry

## 2017-04-13 DIAGNOSIS — E1122 Type 2 diabetes mellitus with diabetic chronic kidney disease: Secondary | ICD-10-CM

## 2017-04-13 DIAGNOSIS — B351 Tinea unguium: Secondary | ICD-10-CM

## 2017-04-13 DIAGNOSIS — E119 Type 2 diabetes mellitus without complications: Secondary | ICD-10-CM

## 2017-04-13 DIAGNOSIS — M79609 Pain in unspecified limb: Secondary | ICD-10-CM

## 2017-04-13 NOTE — Progress Notes (Signed)
Complaint:  Visit Type: Patient returns to my office for continued preventative foot care services. Complaint: Patient states" my nails have grown long and thick and become painful to walk and wear shoes" Patient has been diagnosed with DM with no foot complications. The patient presents for preventative foot care services. No changes to ROS  Podiatric Exam: Vascular: dorsalis pedis and posterior tibial pulses are palpable bilateral. Capillary return is immediate. Temperature gradient is WNL. Skin turgor WNL  Sensorium: Diminished  Semmes Weinstein monofilament test. Normal tactile sensation bilaterally. Nail Exam: Pt has thick disfigured discolored nails with subungual debris noted bilateral entire nail hallux through fifth toenails Ulcer Exam: There is no evidence of ulcer or pre-ulcerative changes or infection. Orthopedic Exam: Muscle tone and strength are WNL. No limitations in general ROM. No crepitus or effusions noted. Foot type and digits show no abnormalities. Bony prominences are unremarkable. Skin: No Porokeratosis. No infection or ulcers  Diagnosis:  Onychomycosis, , Pain in right toe, pain in left toes  Treatment & Plan Procedures and Treatment: Consent by patient was obtained for treatment procedures. The patient understood the discussion of treatment and procedures well. All questions were answered thoroughly reviewed. Debridement of mycotic and hypertrophic toenails, 1 through 5 bilateral and clearing of subungual debris. No ulceration, no infection noted.  Return Visit-Office Procedure: Patient instructed to return to the office for a follow up visit 3 months for continued evaluation and treatment.    Conita Amenta DPM 

## 2017-04-19 ENCOUNTER — Encounter: Payer: Self-pay | Admitting: Emergency Medicine

## 2017-04-19 ENCOUNTER — Emergency Department: Payer: Medicare Other

## 2017-04-19 ENCOUNTER — Emergency Department
Admission: EM | Admit: 2017-04-19 | Discharge: 2017-04-19 | Disposition: A | Discharge status: Home / Self Care | Payer: Medicare Other | Attending: Emergency Medicine | Admitting: Emergency Medicine

## 2017-04-19 DIAGNOSIS — Z96642 Presence of left artificial hip joint: Secondary | ICD-10-CM | POA: Diagnosis not present

## 2017-04-19 DIAGNOSIS — S0990XA Unspecified injury of head, initial encounter: Secondary | ICD-10-CM

## 2017-04-19 DIAGNOSIS — W010XXA Fall on same level from slipping, tripping and stumbling without subsequent striking against object, initial encounter: Secondary | ICD-10-CM | POA: Insufficient documentation

## 2017-04-19 DIAGNOSIS — Z79899 Other long term (current) drug therapy: Secondary | ICD-10-CM | POA: Diagnosis not present

## 2017-04-19 DIAGNOSIS — S0181XA Laceration without foreign body of other part of head, initial encounter: Secondary | ICD-10-CM

## 2017-04-19 DIAGNOSIS — N189 Chronic kidney disease, unspecified: Secondary | ICD-10-CM | POA: Insufficient documentation

## 2017-04-19 DIAGNOSIS — E1122 Type 2 diabetes mellitus with diabetic chronic kidney disease: Secondary | ICD-10-CM | POA: Diagnosis not present

## 2017-04-19 DIAGNOSIS — S01112A Laceration without foreign body of left eyelid and periocular area, initial encounter: Secondary | ICD-10-CM | POA: Insufficient documentation

## 2017-04-19 DIAGNOSIS — Z23 Encounter for immunization: Secondary | ICD-10-CM | POA: Diagnosis not present

## 2017-04-19 DIAGNOSIS — Y93E5 Activity, floor mopping and cleaning: Secondary | ICD-10-CM | POA: Diagnosis not present

## 2017-04-19 DIAGNOSIS — Y92010 Kitchen of single-family (private) house as the place of occurrence of the external cause: Secondary | ICD-10-CM | POA: Insufficient documentation

## 2017-04-19 DIAGNOSIS — Y999 Unspecified external cause status: Secondary | ICD-10-CM | POA: Insufficient documentation

## 2017-04-19 DIAGNOSIS — W19XXXA Unspecified fall, initial encounter: Secondary | ICD-10-CM

## 2017-04-19 LAB — BASIC METABOLIC PANEL
ANION GAP: 9 (ref 5–15)
BUN: 25 mg/dL — ABNORMAL HIGH (ref 6–20)
CALCIUM: 9.5 mg/dL (ref 8.9–10.3)
CHLORIDE: 101 mmol/L (ref 101–111)
CO2: 27 mmol/L (ref 22–32)
CREATININE: 1.71 mg/dL — AB (ref 0.61–1.24)
GFR calc non Af Amer: 36 mL/min — ABNORMAL LOW (ref >60)
GFR, EST AFRICAN AMERICAN: 41 mL/min — AB (ref >60)
Glucose, Bld: 274 mg/dL — ABNORMAL HIGH (ref 65–99)
Potassium: 4.1 mmol/L (ref 3.5–5.1)
SODIUM: 137 mmol/L (ref 135–145)

## 2017-04-19 LAB — URINALYSIS, ROUTINE W REFLEX MICROSCOPIC
BILIRUBIN URINE: NEGATIVE
Ketones, ur: NEGATIVE mg/dL
LEUKOCYTES UA: NEGATIVE
NITRITE: NEGATIVE
Protein, ur: 30 mg/dL — AB
SPECIFIC GRAVITY, URINE: 1.016 (ref 1.005–1.030)
pH: 5 (ref 5.0–8.0)

## 2017-04-19 LAB — CBC
HCT: 39.5 % — ABNORMAL LOW (ref 40.0–52.0)
HEMOGLOBIN: 13.6 g/dL (ref 13.0–18.0)
MCH: 31.4 pg (ref 26.0–34.0)
MCHC: 34.3 g/dL (ref 32.0–36.0)
MCV: 91.6 fL (ref 80.0–100.0)
Platelets: 138 10*3/uL — ABNORMAL LOW (ref 150–440)
RBC: 4.31 MIL/uL — ABNORMAL LOW (ref 4.40–5.90)
RDW: 14.2 % (ref 11.5–14.5)
WBC: 10.8 10*3/uL — AB (ref 3.8–10.6)

## 2017-04-19 LAB — TROPONIN I: Troponin I: <0.03 ng/mL (ref <0.03)

## 2017-04-19 MED ORDER — LIDOCAINE HCL (PF) 1 % IJ SOLN
5.0000 mL | Freq: Once | INTRAMUSCULAR | Status: AC
  Administered 2017-04-19: 5 mL

## 2017-04-19 MED ORDER — LIDOCAINE HCL (PF) 1 % IJ SOLN
INTRAMUSCULAR | Status: AC
  Administered 2017-04-19: 5 mL
  Filled 2017-04-19: qty 5

## 2017-04-19 MED ORDER — TETANUS-DIPHTH-ACELL PERTUSSIS 5-2.5-18.5 LF-MCG/0.5 IM SUSP
0.5000 mL | Freq: Once | INTRAMUSCULAR | Status: AC
  Administered 2017-04-19: 0.5 mL via INTRAMUSCULAR
  Filled 2017-04-19: qty 0.5

## 2017-04-19 NOTE — ED Triage Notes (Signed)
Pt presents to ED via [place] from [facility] independent living c/o unwitnessed fall. EMS report pt was found on floor by staff with lac to L forehead and dried blood on face. Pt states he fell while trying to clean floor in kitchen, crawled to bathroom to alert staff but was unaware of lac or bleeding and is not sure what happened. Pt A&Ox4, ambulatory from EMS stretcher to bed. No c/o pain at this time. Denies use of blood thinners.

## 2017-04-19 NOTE — ED Notes (Addendum)
Received call from [place] [phone]) who states they will send TL security to pick pt up when discharged.

## 2017-04-19 NOTE — ED Notes (Signed)
Resumed care from [place] rn. Pt alert. Pt waiting on transport to nursing home.

## 2017-04-19 NOTE — Clinical Social Work Note (Signed)
Clinical Social Work Assessment  Patient Details  Name: Derek Guerra MRN: [id number] Date of Birth: 07/27/1932  Date of referral:  04/19/17               Reason for consult:  Facility Placement                Permission sought to share information with:  Family Supports Permission granted to share information::  Yes, Verbal Permission Granted  Name::     Mr and Mrs Mel Almond [phone]  Agency::     Relationship::     Contact Information:     Housing/Transportation Living arrangements for the past 2 months:  [place] ([facility] independent living) Source of Information:  Patient, Guardian Patient Interpreter Needed:  None Criminal Activity/Legal Involvement Pertinent to Current Situation/Hospitalization:  No - Comment as needed Significant Relationships:  None, Other Family Members (Mr and Mrs Mel Almond) Lives with:    Do you feel safe going back to the place where you live?  Yes Need for family participation in patient care:  Yes (Comment)  Care giving concerns: HCPOA Mr and Mrs Mel Almond is aware patient requires ALF and is making arrangements for additional in home supports.   Social Worker assessment / plan: LCSW introduced myself to patient and obtained verbal consent to conduct assessment/contact HCPOA and friends. Patient is in [place] living at [facility] and will have increased care by his HCPOA to increase safety concerns. Patient is not willing to go to SNF or have people in his house. And HCPOA is aware of his reluctance. Reviewed some safety precautions with patient, wear slippers ,remove loose mats, have phone on him at all times.  Employment status:  Retired Forensic scientist:  [employer]) PT Recommendations:  Not assessed at this time Information / Referral to community resources:  [place]  Patient/Family's Response to care: He requested to go home  Patient/Family's Understanding of and Emotional Response to Diagnosis, Current  Treatment, and Prognosis:  They will see to patients needs  Emotional Assessment Appearance:  Appears stated age Attitude/Demeanor/Rapport:   (Polite, calm and oriented x4) Affect (typically observed):  Adaptable, Calm Orientation:  Oriented to Self, Oriented to Place, Oriented to  Time, Oriented to Situation Alcohol / Substance use:  Not Applicable Psych involvement (Current and /or in the community):  No (Comment)  Discharge Needs  Concerns to be addressed:  No discharge needs identified Readmission within the last 30 days:  No Current discharge risk:  None Barriers to Discharge:  Continued Medical Work up   Joana Reamer, LCSW 04/19/2017, 2:57 PM

## 2017-04-19 NOTE — ED Notes (Signed)
Patient transported to CT 

## 2017-04-19 NOTE — Discharge Instructions (Signed)
You have been seen in the emergency department for a fall. Her workup as shown normal results. Her laceration has been repaired with absorbable sutures. These will fall out over the next several days. Return to the emergency department for any signs of infection such as increased redness, pain, pus or fever. Otherwise please follow-up with your primary care doctor within the next 1-2 days for recheck/reevaluation.

## 2017-04-19 NOTE — ED Notes (Signed)
Cleansed lac and dried blood from pt's face and forehead as much as possible with NS.

## 2017-04-19 NOTE — ED Provider Notes (Addendum)
[facility] Emergency Department Provider Note  Time seen: 11:50 AM  I have reviewed the triage vital signs and the nursing notes.   HISTORY  Chief Complaint No chief complaint on file.    HPI Derek Guerra is a 83 y.o. male with a past medical history of CK D, diabetes, presents to the emergency department after a fall. According to EMS report the patient lives at [facility], but lives alone. Patient states his wife passed away approximate 6  months ago. Patient called out today for a fall. Noted to have a laceration to the left eyebrow. Patient does not recall the fall or at what time it happened. EMS reports there was dried blood at the house. Patient is otherwise alert and oriented 3. Denies any medical complaints. States a mild headache, denies any extremity pain. States he has been ambulatory since the fall as he was walking around the apartment trying to clean up the blood.  Past Medical History:  Diagnosis Date  . Chronic kidney disease   . Diabetes mellitus without complication ([name])   . Prostate cancer ([name])   . Urinary incontinence     Patient Active Problem List   Diagnosis Date Noted  . Status post total replacement of left hip 01/14/2017  . Ileus ([place]) 11/28/2016  . Acute on chronic renal failure ([name]) 11/28/2016  . DKA (diabetic ketoacidoses) ([name]) 11/28/2016  . Leukopenia 11/28/2016  . Thrombocytopenia ([name]) 11/28/2016  . Generalized weakness 11/28/2016  . Dehydration 11/28/2016  . Acute urinary retention 11/28/2016  . Enteritis 11/26/2016  . Unsteadiness 08/22/2015  . Beat, premature ventricular 02/14/2015  . Benign essential HTN 02/02/2015  . Bladder infection, chronic 10/10/2014  . Incomplete bladder emptying 10/10/2014  . Atherosclerosis of aorta ([name]) 08/14/2014  . Diabetes mellitus ([name]) 08/14/2014  . History of cardiac catheterization 08/14/2014  . Left ventricular dysfunction 08/14/2014  . Combined fat and  carbohydrate induced hyperlipemia 08/14/2014  . Peripheral vascular disease ([place]) 08/14/2014  . Disorder of kidney and ureter 08/14/2014  . Type 2 diabetes mellitus with diabetic chronic kidney disease ([name]) 03/27/2014  . Acontractile bladder 03/23/2014  . H/O malignant neoplasm of prostate 03/20/2014  . BP (high blood pressure) 03/10/2013  . Type 2 diabetes mellitus ([name]) 02/16/2013  . Abnormal gait 02/25/2011  . Cannot sleep 11/21/2010    Past Surgical History:  Procedure Laterality Date  . PROSTATE SURGERY    . TOTAL HIP ARTHROPLASTY Left 01/14/2017   Procedure: TOTAL HIP ARTHROPLASTY;  Surgeon: [name], MD;  Location: [facility] ORS;  Service: Orthopedics;  Laterality: Left;    Prior to Admission medications   Medication Sig Start Date End Date Taking? Authorizing Provider  amitriptyline (ELAVIL) 10 MG tablet Take 30 mg by mouth. 02/11/17   [provider]  enalapril (VASOTEC) 10 MG tablet Take 10 mg by mouth. 02/11/17   [provider]  enoxaparin (LOVENOX) 30 MG/0.3ML injection Inject 0.3 mLs (30 mg total) into the skin every 12 (twelve) hours. 01/16/17   [name], PA  finasteride (PROSCAR) 5 MG tablet Take by mouth. 11/29/16   [provider]  linagliptin (TRADJENTA) 5 MG TABS tablet Take 5 mg by mouth. 02/11/17   [provider]  nitrofurantoin (MACRODANTIN) 50 MG capsule Take 50 mg by mouth daily.    [provider]  oxyCODONE (OXY IR/ROXICODONE) 5 MG immediate release tablet Take 1-2 tablets (5-10 mg total) by mouth every 4 (four) hours as needed for severe pain. 01/16/17  [name], PA  pantoprazole (PROTONIX) 40 MG tablet Take by mouth. 11/29/16   [provider]  polyethylene glycol (MIRALAX / GLYCOLAX) packet Take 17 g by mouth daily as needed for mild constipation.    [provider]  rosuvastatin (CRESTOR) 5 MG tablet Take 5 mg by mouth. 02/11/17   [provider]  senna (SENOKOT) 8.6 MG TABS tablet  Take 1 tablet by mouth daily as needed for mild constipation.    [provider]  tamsulosin (FLOMAX) 0.4 MG CAPS capsule Take by mouth. 06/12/15   [provider]  traMADol (ULTRAM) 50 MG tablet Take 1-2 tablets (50-100 mg total) by mouth every 4 (four) hours as needed for moderate pain. 01/16/17   [name], PA    Allergies  Allergen Reactions  . Gemfibrozil Other (See Comments)    Other reaction(s): MUSCLE PAIN Other reaction(s): MUSCLE PAIN  . Niacin Other (See Comments)    Other reaction(s): MUSCLE PAIN Other reaction(s): MUSCLE PAIN  . Nifedipine Other (See Comments)    Other reaction(s): MUSCLE PAIN Other reaction(s): MUSCLE PAIN  . Simvastatin Other (See Comments)    Other reaction(s): MUSCLE PAIN Other reaction(s): MUSCLE PAIN    Family History  Problem Relation Age of Onset  . Hypertension Neg Hx   . Diabetes Neg Hx     Social History Social History  Substance Use Topics  . Smoking status: Never Smoker  . Smokeless tobacco: Never Used  . Alcohol use No    Review of Systems Constitutional: Negative for fever. Cardiovascular: Negative for chest pain. Respiratory: Negative for shortness of breath. Gastrointestinal: Negative for abdominal pain Genitourinary: Negative for dysuria. Musculoskeletal: Negative for back pain. Negative for neck pain. Neurological: Negative for headaches, focal weakness or numbness. All other ROS negative  ____________________________________________   PHYSICAL EXAM:  Constitutional: Alert and oriented. Well appearing and in no distress. Eyes: Normal exam, EOMI ENT   Head: 2 cm laceration to left eyebrow   Mouth/Throat: Mucous membranes are moist. Cardiovascular: Normal rate, regular rhythm. No murmur Respiratory: Normal respiratory effort without tachypnea nor retractions. Breath sounds are clear  Gastrointestinal: Soft and nontender. No distention Musculoskeletal: Nontender with normal range of motion  in all extremities.  Neurologic:  Normal speech and language. No gross focal neurologic deficits  Skin:  Skin is warm, dry and intact.  Psychiatric: Mood and affect are normal.   ____________________________________________   INITIAL IMPRESSION / ASSESSMENT AND PLAN / ED COURSE  Pertinent labs & imaging results that were available during my care of the patient were reviewed by me and considered in my medical decision making (see chart for details).  the patient presents to the emergency department after a fall with a head injury. Patient does not recall the specifics surrounding the fall. Is not sure if he passed out. We'll obtain a CT scan of the head and C-spine as a precaution. We will check labs and a urinalysis. Overall the patient appears well, oriented 3.    EKG reviewed and interpreted by myself shows normal sinus rhythm at 71 bpm, slightly widened QRS, mild left axis deviation, largely normal intervals with nonspecific but no concerning ST changes.   LACERATION REPAIR Performed by: Harvest Dark Authorized by: Harvest Dark Consent: Verbal consent obtained. Risks and benefits: risks, benefits and alternatives were discussed Consent given by: patient Patient identity confirmed: provided demographic data Prepped and Draped in normal sterile fashion Wound explored  Laceration Location: left eyebrow  Laceration Length: 2cm stellate  No Foreign Bodies seen or palpated  Anesthesia: local infiltration  Local anesthetic: lidocaine 1% wo epinephrine  Anesthetic total: 4 ml  Irrigation method: syringe Amount of cleaning: standard  Skin closure: 5-0 rapid vicryl  Number of sutures: 5  Technique: simple interrupted  Patient tolerance: Patient tolerated the procedure well with no immediate complications.   Patient's workup as shown largely normal results, mild renal insufficiency, but largely at baseline. Patient is mildly hyperglycemic. Normal anion gap.  No white blood cell count elevation, normal urinalysis.  CT scan of the head is negative. CT scan of the C-spine is negative. Charge nurse has discussed the patient with his daughter and power of attorney Renata Caprice. She is concerned because the patient has been having increased falls recently over the last few months, not eating very well and his blood glucose has been running very high. Here the patient is alert and oriented 3. His medical workup has been largely nonrevealing. However there was report by EMS of dried feces on the person and around the bathroom. Daughter is concerned that the patient may not be able to adequately care for himself at home. We'll involve the Education officer, museum for further input.   Social worker has seen and evaluated the patient. He is alert and oriented 4 currently. The patient appears very well. She has discussed the patient with the power of attorney. Patient has means for in-home, home health. [facility] is at [facility] where [facility] to help. Does not wish to be placed into a skilled living facility or assisted living facility at this time. We will discharge home as per the patient's wishes.  ____________________________________________   FINAL CLINICAL IMPRESSION(S) / ED DIAGNOSES  fall Head injury    Harvest Dark, MD 04/19/17 1318    Harvest Dark, MD 04/19/17 1423

## 2017-04-19 NOTE — Progress Notes (Signed)
LCSW met with patient and completed an assessment. He reported he feels fine but ended up in kitchen floor and hit his head. He reports he hasn't been eating as much lately and contributes this to him feeling dizzy. He was widowed in the last 6 months and gave me verbal consent to speak to his HCPOA Mr and Mrs Bailey has contacted administration at [facility] and they will ncrease this patient with additional support at home until transitioned from independent living to assisted living. His HCPOA is aware this patient has increased fall and risk and will be out to see him in 2 weeks.  Called neighbor and left HIPPA friendly message. Jean Schwegman [phone]   Spoke to ED RN and EDP and no further needs from LCSW     LCSW [phone] 

## 2017-04-19 NOTE — ED Notes (Signed)
Iv dc'ed  D/c inst to pt and [facility] nutritionist.  Pt alert.

## 2017-04-23 DIAGNOSIS — N400 Enlarged prostate without lower urinary tract symptoms: Secondary | ICD-10-CM | POA: Diagnosis not present

## 2017-04-23 DIAGNOSIS — M6281 Muscle weakness (generalized): Secondary | ICD-10-CM | POA: Diagnosis not present

## 2017-04-23 DIAGNOSIS — E1121 Type 2 diabetes mellitus with diabetic nephropathy: Secondary | ICD-10-CM

## 2017-04-23 DIAGNOSIS — N183 Chronic kidney disease, stage 3 (moderate): Secondary | ICD-10-CM

## 2017-04-23 DIAGNOSIS — W19XXXA Unspecified fall, initial encounter: Secondary | ICD-10-CM | POA: Diagnosis not present

## 2017-04-23 DIAGNOSIS — G3184 Mild cognitive impairment, so stated: Secondary | ICD-10-CM | POA: Diagnosis not present

## 2017-04-23 LAB — URINE CULTURE

## 2017-04-24 MED ORDER — CIPROFLOXACIN HCL 500 MG PO TABS: 500.0000 mg | ORAL_TABLET | Freq: Two times a day (BID) | ORAL | 0 refills | Status: AC

## 2017-04-24 NOTE — Discharge Planning (Signed)
8/12 positive urine culture for enterobacter aerogenes. Spoke with Dr Corky Downs who agrees should be treated with cipro 500mg  po bid x 10 days. Called HCPOA Renata Caprice who states she is a Software engineer in [place]. She instructed me to call in script to [facility] [address] in [place], [place]. I called in the prescription, about 1 hr later she called the ED and spoke with me saying because the patient is admitted at [facility] the script would need to be sent there with cultures and provided fax [phone], which I read back. I confirmed number and faxed a rx for cipro 500mg  po bid x 10 days that Dr Mable Paris provided after explaining the situation and Dr Corky Downs leaving. I also faxed microbiology culture results for continuity of care  Thomasenia Sales, PharmD, MBA, [facility]

## 2017-04-24 NOTE — ED Provider Notes (Signed)
Culture call back. The patient has a UTI sensitive to ciprofloxacin. Pharmacy discussed the case with Dr. Corky Downs who recommended ciprofloxacin 500 mg by mouth twice a day for 10 days. The patient is currently at [facility] and requires a prescription faxed in. I wrote the prescription now.   Darel Hong, MD 04/24/17 [phone]

## 2017-05-28 DIAGNOSIS — N183 Chronic kidney disease, stage 3 (moderate): Secondary | ICD-10-CM

## 2017-05-28 DIAGNOSIS — R531 Weakness: Secondary | ICD-10-CM

## 2017-05-28 DIAGNOSIS — N400 Enlarged prostate without lower urinary tract symptoms: Secondary | ICD-10-CM | POA: Diagnosis not present

## 2017-05-28 DIAGNOSIS — E1121 Type 2 diabetes mellitus with diabetic nephropathy: Secondary | ICD-10-CM

## 2017-05-28 DIAGNOSIS — G3184 Mild cognitive impairment, so stated: Secondary | ICD-10-CM | POA: Diagnosis not present

## 2017-07-13 ENCOUNTER — Ambulatory Visit: Payer: Medicare Other | Admitting: Podiatry

## 2018-12-11 IMAGING — DX DG ABDOMEN 1V
1 series · 1 of 1 positions shown · non-contrast
Comparison: None.

CLINICAL DATA: Weakness, emesis, diarrhea for 4 days.

EXAM:
ABDOMEN - 1 VIEW

[abdomen kub]
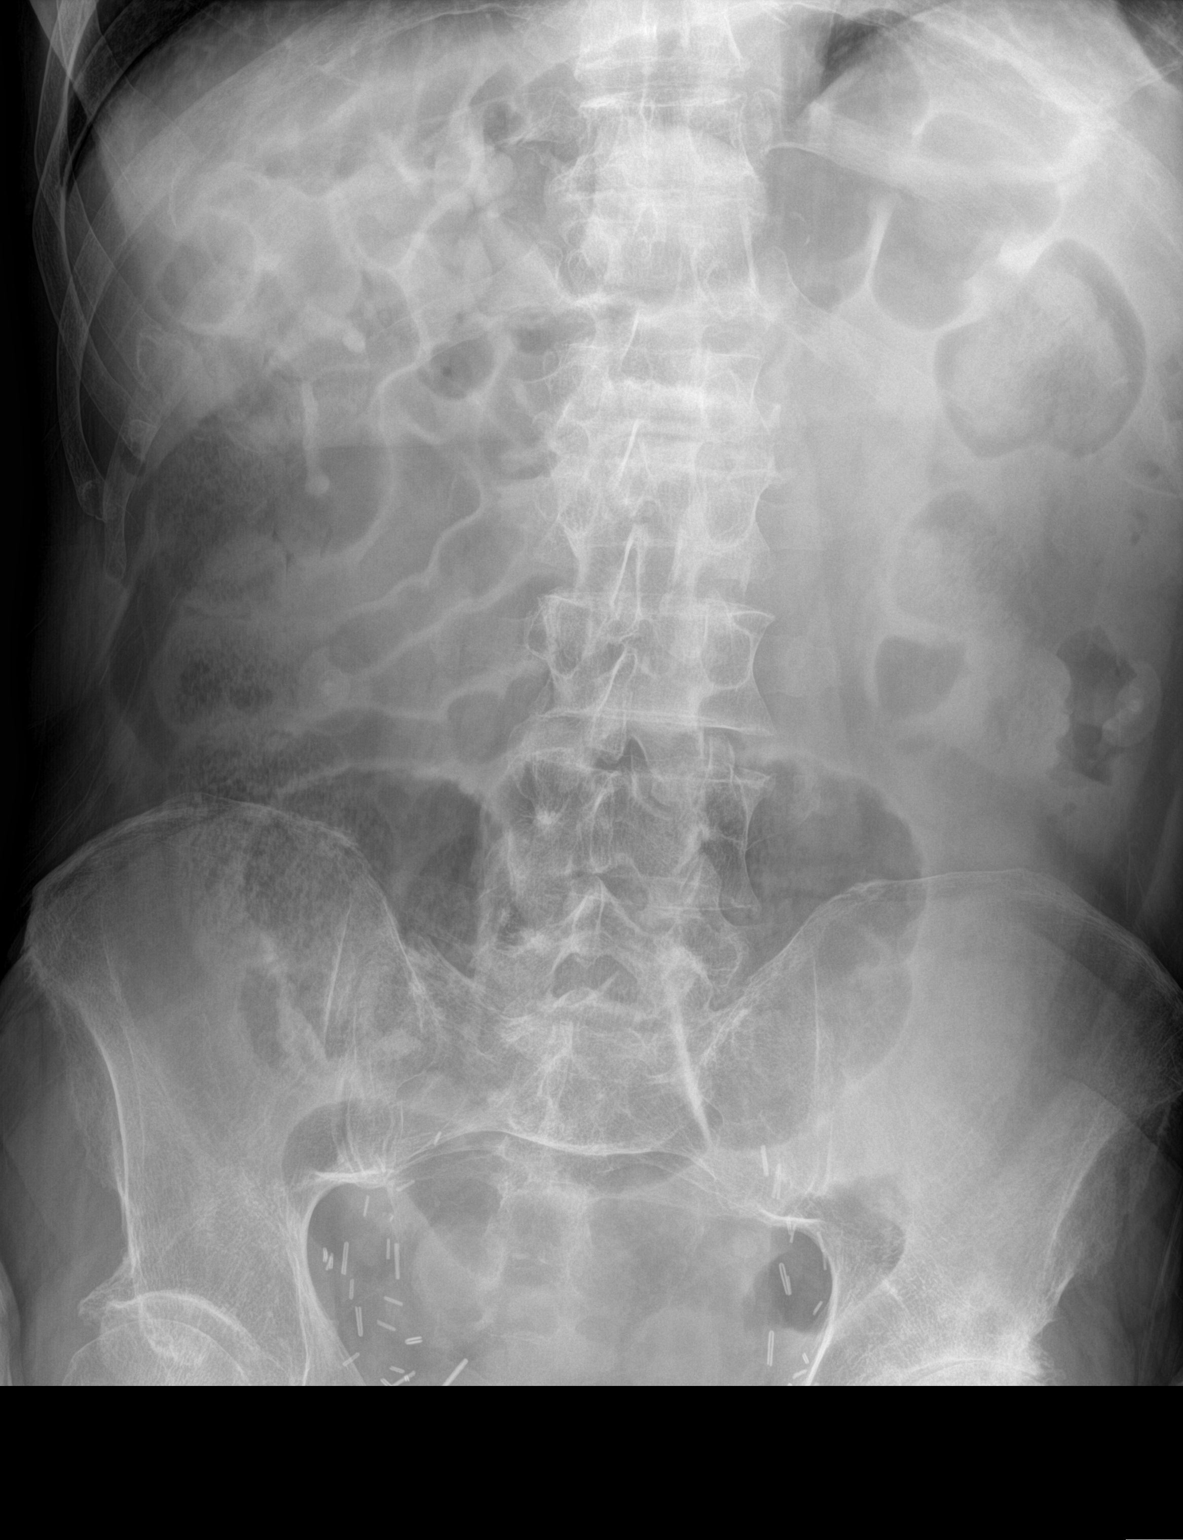

[1 of 1 positions shown; findings below may reference images not displayed]

FINDINGS: There is distention of small and large bowel loops. Postop changes
project over the pelvis. Descending colon is relatively
decompressed. No obvious free intraperitoneal gas. Severe
degenerative change of the left hip joint.
IMPRESSION: Generalized bowel distension most consistent with a mild ileus
pattern. Descending colon is relatively decompressed.

## 2019-05-04 IMAGING — CT CT HEAD W/O CM
5 of 8 series · 16 of 47 positions shown, 17 images · non-contrast
Comparison: None.

CLINICAL DATA: Fall

EXAM:
CT HEAD WITHOUT CONTRAST
CT CERVICAL SPINE WITHOUT CONTRAST
TECHNIQUE: Multidetector CT imaging of the head and cervical spine was
performed following the standard protocol without intravenous
contrast. Multiplanar CT image reconstructions of the cervical spine
were also generated.

[Series 2: head wo · axial · 0.46mm/px · z∈[-92,-42]mm · 2 of 31 slices shown, 3 images]
[im 11/31  brain]
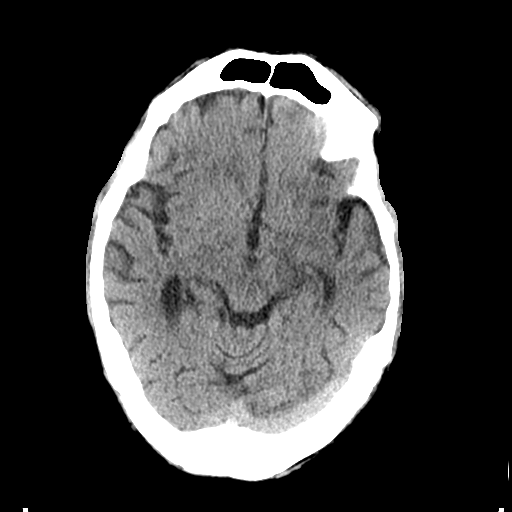
[im 11/31  bone]
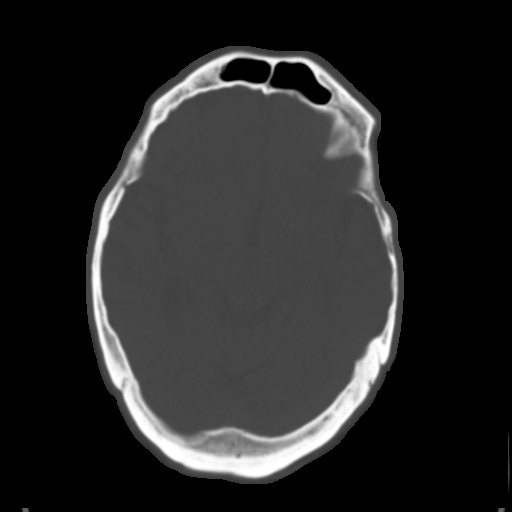
[im 21/31  brain]
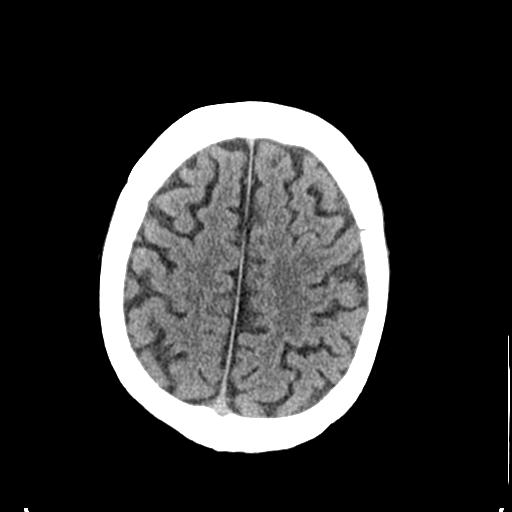

[Series 4: coronal soft tissue · coronal · 0.31mm/px · 3 of 65 slices shown]
[im 25/65  brain]
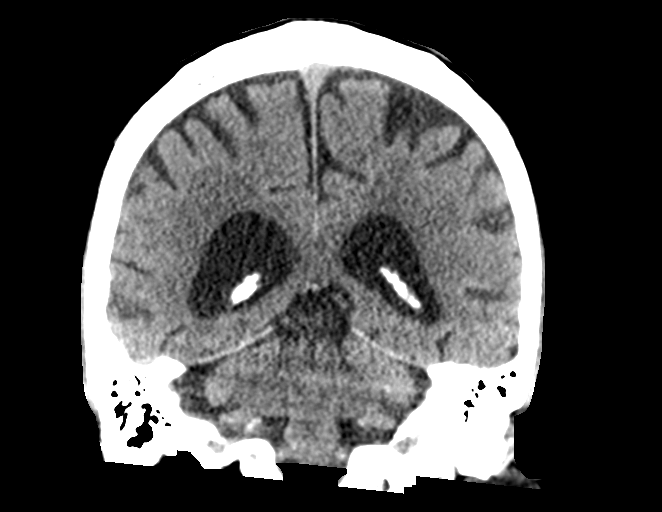
[im 33/65  brain]
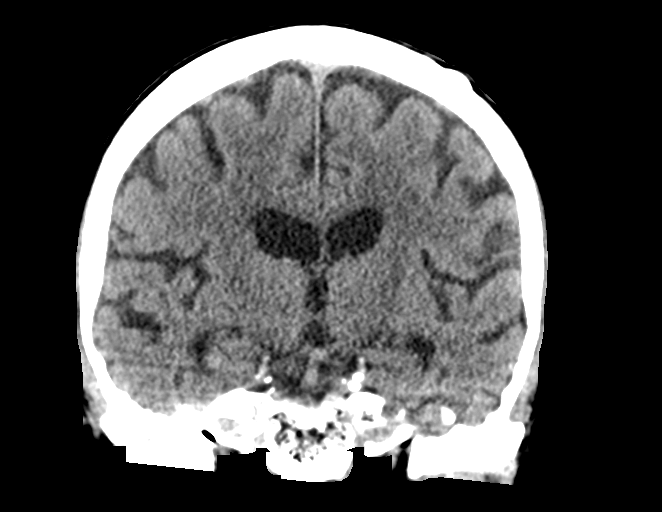
[im 41/65  brain]
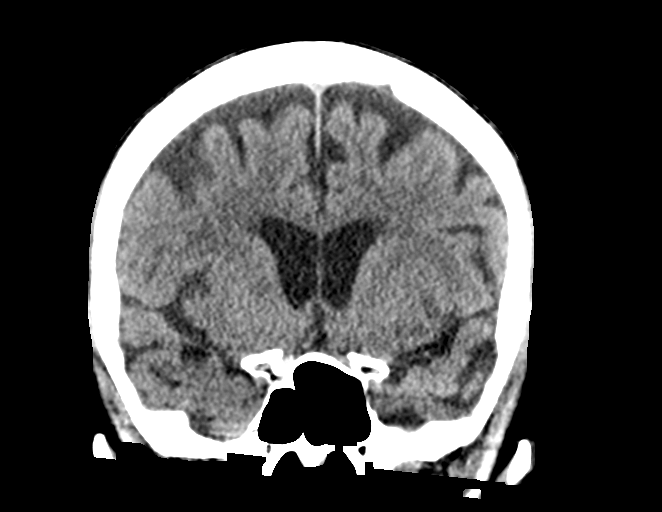

[Series 5: ax head wo · axial · 0.40mm/px · z∈[-107,-63]mm · 2 of 29 slices shown]
[im 10/29  brain]
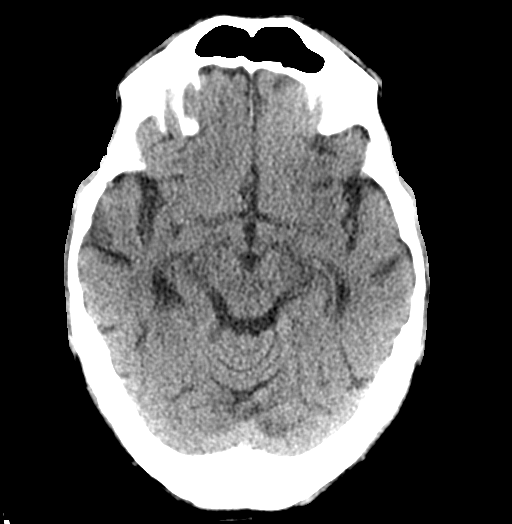
[im 19/29  brain]
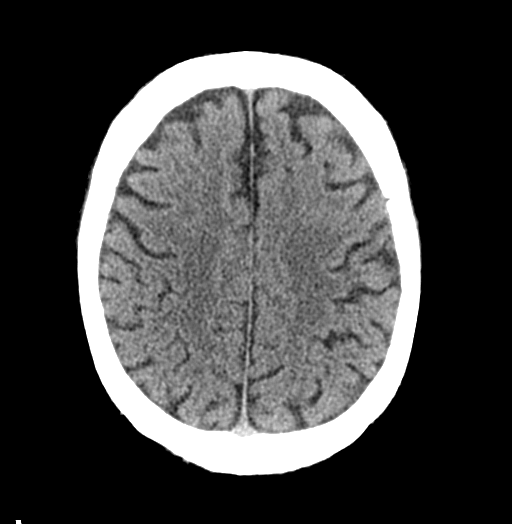

[Series 7: sagittal soft tissue · sagittal · 0.31mm/px · 1 of 52 slices shown]
[im 26/52  brain]
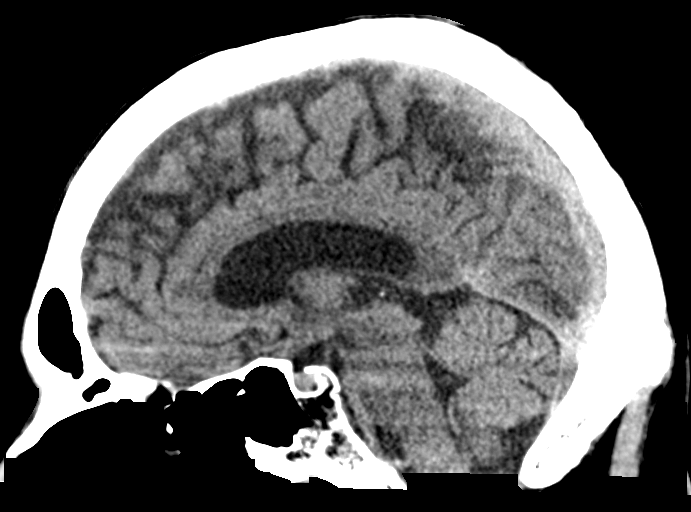

[Series 12: orthogonal bone · axial · 0.25mm/px · z∈[-331,-210]mm · 8 of 94 slices shown]
[im 10/94  bone]
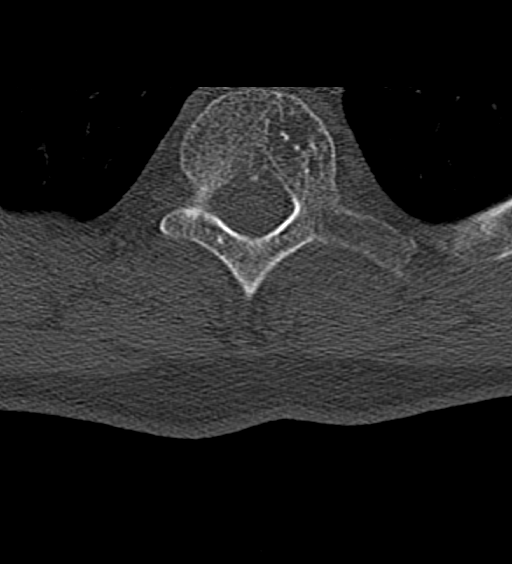
[im 19/94  bone]
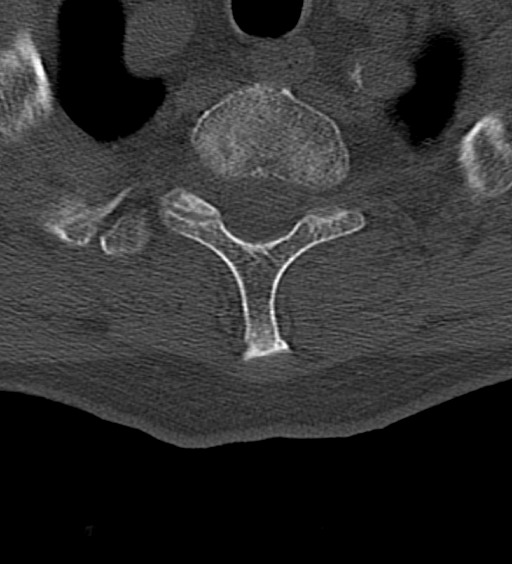
[im 28/94  bone]
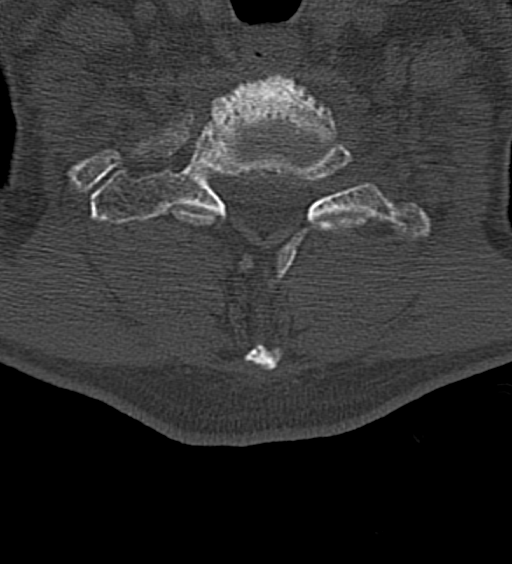
[im 38/94  bone]
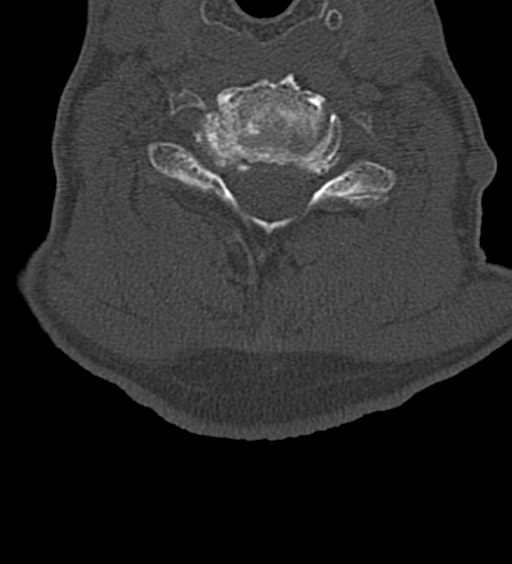
[im 56/94  bone]
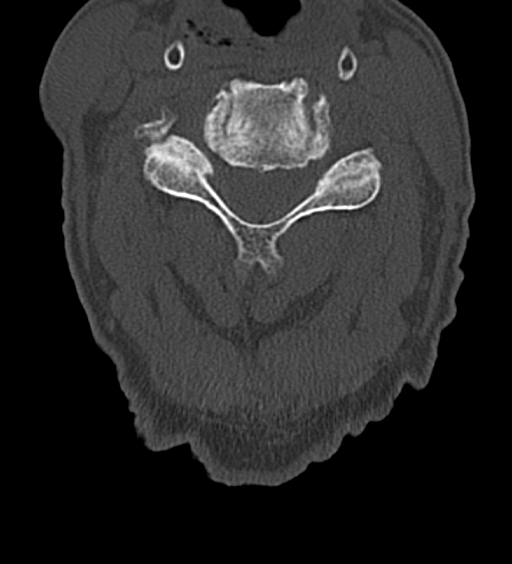
[im 66/94  bone]
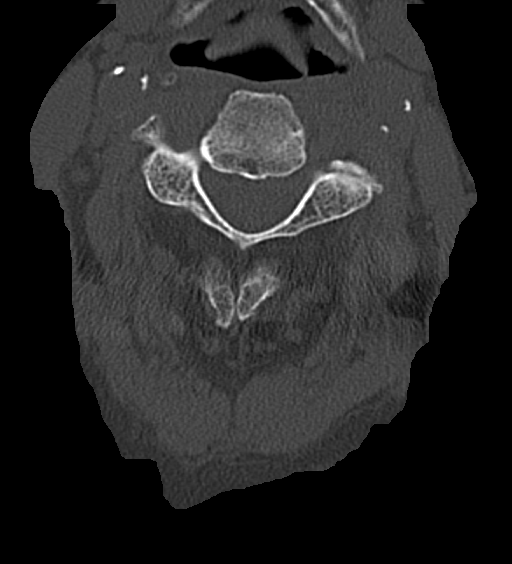
[im 75/94  bone]
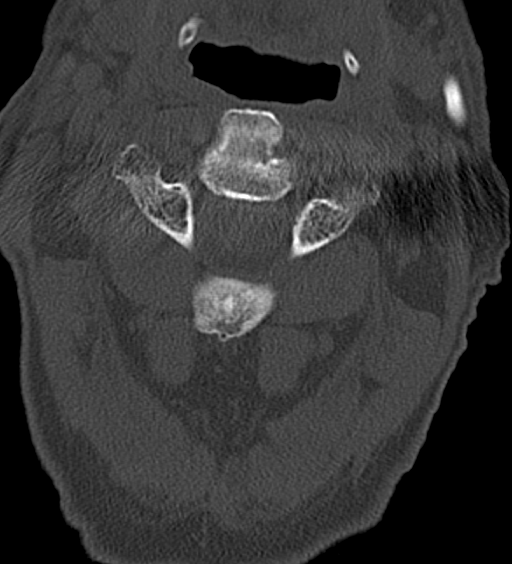
[im 84/94  bone]
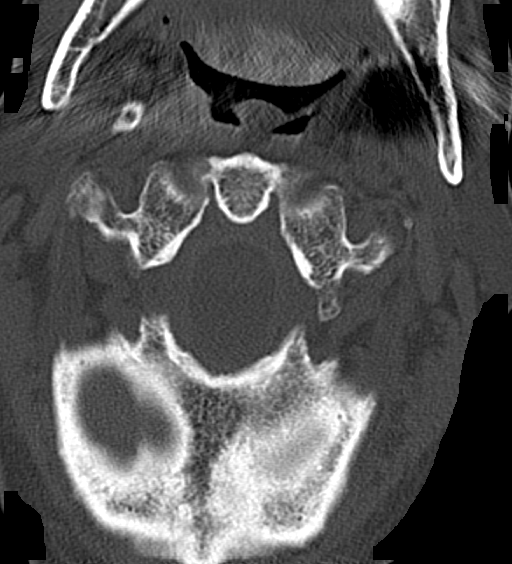

[16 of 47 positions shown; findings below may reference images not displayed]

FINDINGS: CT HEAD FINDINGS

Brain: Global atrophy. Chronic ischemic changes in the
periventricular white matter. No mass effect, midline shift, or
acute intracranial hemorrhage.

Vascular: No hyperdense vessel or unexpected calcification.

Skull: Intact.

Sinuses/Orbits: Mastoid air cells are clear. Small mucous retention
cysts in the maxillary sinuses. Ethmoid air cells and frontal
sinuses are clear. Small mucous cysts in the left sphenoid sinus.

Other: Noncontributory.

CT CERVICAL SPINE FINDINGS

Alignment: Anatomic.

Skull base and vertebrae: No vertebral compression fracture. No
dislocation.

Soft tissues and spinal canal: No obvious spinal canal hematoma. No
prevertebral edema. No obvious soft tissue injury.

Disc levels: Posterior disc bulge at C3-4 and C4-5 causes an element
of spinal stenosis. Left paracentral disc osteophytes at C5-6 with
severe disc space narrowing. There are bilateral uncovertebral
osteophytes at C5-6 and C6-7 which encroach upon the foramina.
Left-sided T2 vertebral body hemangioma.

Upper chest: Clear.

Other: Noncontributory
IMPRESSION: No acute intracranial pathology. Atrophy and chronic ischemic
changes are noted.

No evidence of acute cervical spine injury. Degenerative changes are
noted.

## 2023-11-07 DEATH — deceased
